# Patient Record
Sex: Male | Born: 1955
Health system: Southern US, Community
[De-identification: ages and names within clinical notes are randomized; demographics above are authoritative.]

## PROBLEM LIST (undated history)

## (undated) DIAGNOSIS — E781 Pure hyperglyceridemia: Secondary | ICD-10-CM

## (undated) DIAGNOSIS — H539 Unspecified visual disturbance: Secondary | ICD-10-CM

## (undated) DIAGNOSIS — I639 Cerebral infarction, unspecified: Secondary | ICD-10-CM

## (undated) DIAGNOSIS — D6859 Other primary thrombophilia: Secondary | ICD-10-CM

## (undated) DIAGNOSIS — Q289 Congenital malformation of circulatory system, unspecified: Secondary | ICD-10-CM

## (undated) DIAGNOSIS — G931 Anoxic brain damage, not elsewhere classified: Secondary | ICD-10-CM

## (undated) HISTORY — DX: Other primary thrombophilia: D68.59

## (undated) HISTORY — DX: Unspecified visual disturbance: H53.9

## (undated) HISTORY — PX: SMALL INTESTINE SURGERY: SHX150

## (undated) HISTORY — DX: Cerebral infarction, unspecified: I63.9

## (undated) HISTORY — DX: Congenital malformation of circulatory system, unspecified: Q28.9

## (undated) HISTORY — DX: Pure hyperglyceridemia: E78.1

## (undated) HISTORY — DX: Anoxic brain damage, not elsewhere classified: G93.1

---

## 1975-07-08 HISTORY — PX: TONSILECTOMY, ADENOIDECTOMY, BILATERAL MYRINGOTOMY AND TUBES: SHX2538

## 1976-07-07 HISTORY — PX: NASAL FRACTURE SURGERY: SHX718

## 1976-07-07 HISTORY — PX: EYE SURGERY: SHX253

## 1988-07-07 HISTORY — PX: ROTATOR CUFF REPAIR: SHX139

## 1996-07-07 HISTORY — PX: LASIK: SHX215

## 2010-07-07 HISTORY — PX: CYST EXCISION: SHX5701

## 2016-01-09 ENCOUNTER — Encounter: Payer: Self-pay | Admitting: Cardiology

## 2016-07-28 ENCOUNTER — Ambulatory Visit (INDEPENDENT_AMBULATORY_CARE_PROVIDER_SITE_OTHER): Payer: BLUE CROSS/BLUE SHIELD

## 2016-07-28 ENCOUNTER — Ambulatory Visit (INDEPENDENT_AMBULATORY_CARE_PROVIDER_SITE_OTHER): Payer: BLUE CROSS/BLUE SHIELD | Admitting: Orthopedic Surgery

## 2016-07-28 DIAGNOSIS — M545 Low back pain: Secondary | ICD-10-CM

## 2016-07-28 DIAGNOSIS — G8929 Other chronic pain: Secondary | ICD-10-CM

## 2016-07-28 NOTE — Progress Notes (Signed)
New patient  Chief complaint lower back pain times several years  61 year old male long-standing history of lower back problems and mid to upper back problems which visit him from time to time with no specific cause. He injured the back many many years ago recovered with therapy and now when his back goes out on him the does exercises and takes Naprosyn  No recent acute injury but about 2 months ago the back went out on him again and he says each time this happens of taking more time to recover while so he finally wants this checked out    Past medical history stroke blood clot gastroesophageal reflux and difficulty with anesthesia  Family history heart disease  Prior surgeries includes tonsillectomy, eye surgery, nasal septum surgery, right shoulder surgery and Lasix surgery right eye  Review of systems  Bowel function normal, bladder function normal, no fever no shortness of breath no chest pain no weight loss  There were no vitals taken for this visit.  General appearance normal grooming hygiene normal appearance body habitus  Oriented 3 Person Pl. and time normal  Mood and affect normal  Gait and station normal  Lumbar spine exam inspection tenderness on the right side of the back just above the iliac crest midline nontender this includes cervical thoracic and lumbar spine Range of motion he was in such pain today could not flex the spine cannot extend the spine Stability right and left hip knee and ankle stable Strength strength right and left leg normal Skin lower back lower legs normal Right and left lower extremity pulses normal Right and left lower extremity sensation and reflexes normal  X-rays Degenerative disc at L5-S1 with anterior osteophyte mild coronal plane abnormality mild sagittal plane abnormality in terms of normal curvature   Diagnosis  Encounter Diagnosis  Name Primary?  . Chronic low back pain, unspecified back pain laterality, with sciatica  presence unspecified Yes     Assessment and plan As the patient has no red flags I am recommending therapy and naproxen follow-up 6 weeks if no improvement we can certainly get an MRI

## 2016-09-15 ENCOUNTER — Ambulatory Visit: Payer: BLUE CROSS/BLUE SHIELD | Admitting: Orthopedic Surgery

## 2016-09-29 ENCOUNTER — Encounter: Payer: Self-pay | Admitting: Orthopedic Surgery

## 2016-09-29 ENCOUNTER — Ambulatory Visit (INDEPENDENT_AMBULATORY_CARE_PROVIDER_SITE_OTHER): Payer: BLUE CROSS/BLUE SHIELD | Admitting: Orthopedic Surgery

## 2016-09-29 DIAGNOSIS — M4716 Other spondylosis with myelopathy, lumbar region: Secondary | ICD-10-CM | POA: Diagnosis not present

## 2016-09-29 NOTE — Progress Notes (Signed)
Progress Note   Patient ID: Douglas BarrowsSamuel D Enochs Jr., male   DOB: 1956-05-09, 61 y.o.   MRN: [id number]  Chief Complaint  Patient presents with  . Follow-up    BACK PAIN    HPI Douglas BarrowsSamuel D Imler Jr. is a 61 y.o. male.   HPI 61 year old male with long-standing lower back pain and episodic discomfort where he is down in bed for several days presents back after his last visit after taking naproxen and having therapy and still having discomfort worrying about his future in terms of his back  Review of Systems Review of Systems He does have a radiating pain down his right leg which is occasional Denies fever  Examination There were no vitals taken for this visit.   Ortho Exam   Medical decision-making Diagnosis, Data, Plan (risk)  Time spent 15 minutes. Most of the visit and the majority of the visit was to discuss treatment options and imaging options  He has severe claustrophobia and will need a CT scan to image his back to guide us on further treatment and to workup the right leg radicular pain  Follow-up after MRI CT  Fuller CanadaStanley Harrison, MD 09/29/2016 3:24 PM

## 2016-10-07 ENCOUNTER — Ambulatory Visit (HOSPITAL_COMMUNITY)
Admission: RE | Admit: 2016-10-07 | Discharge: 2016-10-07 | Disposition: A | Discharge status: Home / Self Care | Payer: BLUE CROSS/BLUE SHIELD | Source: Ambulatory Visit | Attending: Orthopedic Surgery | Admitting: Orthopedic Surgery

## 2016-10-07 DIAGNOSIS — I708 Atherosclerosis of other arteries: Secondary | ICD-10-CM | POA: Diagnosis not present

## 2016-10-07 DIAGNOSIS — M5127 Other intervertebral disc displacement, lumbosacral region: Secondary | ICD-10-CM | POA: Insufficient documentation

## 2016-10-07 DIAGNOSIS — I700 Atherosclerosis of aorta: Secondary | ICD-10-CM | POA: Diagnosis not present

## 2016-10-07 DIAGNOSIS — M5126 Other intervertebral disc displacement, lumbar region: Secondary | ICD-10-CM | POA: Diagnosis not present

## 2016-10-07 DIAGNOSIS — M48061 Spinal stenosis, lumbar region without neurogenic claudication: Secondary | ICD-10-CM | POA: Insufficient documentation

## 2016-10-07 DIAGNOSIS — M4716 Other spondylosis with myelopathy, lumbar region: Secondary | ICD-10-CM | POA: Insufficient documentation

## 2016-10-14 ENCOUNTER — Ambulatory Visit (INDEPENDENT_AMBULATORY_CARE_PROVIDER_SITE_OTHER): Payer: BLUE CROSS/BLUE SHIELD | Admitting: Orthopedic Surgery

## 2016-10-14 ENCOUNTER — Encounter: Payer: Self-pay | Admitting: Orthopedic Surgery

## 2016-10-14 VITALS — BP 123/76 | HR 61 | Ht 69.0 in | Wt 197.0 lb

## 2016-10-14 DIAGNOSIS — M5126 Other intervertebral disc displacement, lumbar region: Secondary | ICD-10-CM | POA: Diagnosis not present

## 2016-10-14 DIAGNOSIS — M4716 Other spondylosis with myelopathy, lumbar region: Secondary | ICD-10-CM | POA: Diagnosis not present

## 2016-10-14 NOTE — Progress Notes (Signed)
Chief Complaint  Patient presents with  . Follow-up    CT lumbar results   Patient has a chronic low back pain with intermittent radicular symptoms  Reviewed the MRI listed below. I don't see any discrepancies in the report after reviewing the images  IMPRESSION: Broad-based disc protrusion at multiple levels. At L5-S1, disc protrusion abuts but does not efface the exiting nerve roots lateral to the respective exit foramina bilaterally. There is borderline stenosis at L3-4 L4-5 due to diffuse disc protrusion coupled with bony hypertrophy. No frank disc extrusion is evident on this study. There is multilevel facet osteoarthritic change. Disc degeneration is noted at L5-S1 with a rather prominent subchondral cyst along the anterior inferior aspect of L5. There is vacuum phenomenon at L4-5 and L5-S1. There is minimal retrolisthesis of L5 on S1, felt to be due to underlying spondylosis. No fracture evident.   There is mild aortoiliac atherosclerosis.     Electronically Signed   By: Bretta Bang III M.D.   On: 10/08/2016 08:19   Referral to neurosurgery

## 2017-03-19 ENCOUNTER — Encounter: Payer: Self-pay | Admitting: Family Medicine

## 2017-03-19 ENCOUNTER — Ambulatory Visit (INDEPENDENT_AMBULATORY_CARE_PROVIDER_SITE_OTHER): Payer: BLUE CROSS/BLUE SHIELD | Admitting: Family Medicine

## 2017-03-19 VITALS — BP 122/80 | HR 59 | Ht 69.0 in | Wt 202.0 lb

## 2017-03-19 DIAGNOSIS — Z23 Encounter for immunization: Secondary | ICD-10-CM | POA: Diagnosis not present

## 2017-03-19 DIAGNOSIS — G4710 Hypersomnia, unspecified: Secondary | ICD-10-CM

## 2017-03-19 DIAGNOSIS — G931 Anoxic brain damage, not elsewhere classified: Secondary | ICD-10-CM

## 2017-03-19 NOTE — Progress Notes (Signed)
Patient ID: Douglas BarrowsSamuel D Ganoe Jr., male    DOB: 06-27-1956, 61 y.o.   MRN: [id number]  Chief Complaint  Patient presents with  . Annual Exam    Allergies Patient has no known allergies.  Subjective:   Douglas BarrowsSamuel D Wilmot Jr. is a 61 y.o. male who [facility].  HPI Here to establish care. Patient report that he has a complicated past history. Reports that many years ago he went in for shoulder surgery and had questionable ischemic injury/hypoxic injury to brain. His parents are from [place] and several years ago after his brother commited suicide [place]. Then moved to [place], [place] years.Reports that he has been seen by neurology for years and that his neurologist  just retired. Was told not needed to be seen by neurology but could be followed by PCP. Is having records sent to our office. Reports that after the brain injury he has used adderall 10mg , 1/4 tablet a day. reports that neuro put him on this after the brain injury for mental alertness and b/c slept all the time. MRI and MRA in 2006 which showed problems with the Circle of Willis.Reports that he does not need refill on the adderall today but that he is planning on coming back for CPE. The neurologist used to give him several months of Adderall at a time and does not need it now but wanted to establish care. Does not have any other complaints. Reports that mood is good. Exercises. Sleeps well. Feels good. Takes care of mother and father. Father is bed ridden and has medical problems.   Patient reports that he had his first TIA 1980. 02/2005 CVA and lost some manual dexterity in right hand.     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 Worked there for 14 months. [place] back [place] after [place] died. Father retired and from 1993-2013 had lots of fun and  father was in sports and senior games. Father had stroke in 2013. Father was diagnosed HIV positive in 2000.   Dr. Estelle JuneJohn Gordon Burch. Previous neurologist.     Past Medical History:  Diagnosis Date  . 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 Stroke (cerebrum) (HCC)   . Vision disturbance     Past Surgical History:  Procedure Laterality Date  . 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 EYE SURGERY  1978   esotropia  . LASIK Right 1998  . NASAL FRACTURE SURGERY  1978  . ROTATOR CUFF REPAIR Right 1990   was unable to complete.   . TONSILECTOMY, ADENOIDECTOMY, BILATERAL MYRINGOTOMY AND TUBES  1977    History reviewed. No pertinent family history.   Social History   Social History  . Marital status: Married    Spouse name: N/A  . Number of children: N/A  . Years of education: N/A   Social History Main Topics  . Smoking status: Never Smoker  . Smokeless tobacco: Never Used  . Alcohol use No  . Drug use: No  . Sexual activity: Not Asked   Other Topics Concern  . None   Social History Narrative   Lives in [place], [place]. Exercise.  Eats all food groups.     Review of Systems  Constitutional: Negative for activity change, appetite change, chills, diaphoresis and fatigue.  Respiratory: Negative for choking, chest tightness and shortness of breath.   Cardiovascular: Negative for chest pain and palpitations.  Musculoskeletal: Negative for arthralgias and gait problem.  Skin: Negative for rash.  Neurological: Negative for dizziness, facial asymmetry and headaches.       Has some loss of manual dexterity in right hand since last stroke.   Hematological: Negative for adenopathy. Does not bruise/bleed easily.  Psychiatric/Behavioral: Negative for agitation, confusion, decreased concentration and sleep disturbance. The patient is not nervous/anxious.      Objective:   BP 122/80    Pulse (!) 59   Ht  (1.753 m)   Wt 202 lb (91.6 kg)   SpO2 97%   BMI 29.83 kg/m   Physical Exam  Constitutional: He is oriented to person, place, and time. He appears well-developed and well-nourished.  HENT:  Head: Normocephalic and atraumatic.  Neck: Normal range of motion. Neck supple.  Cardiovascular: Normal rate, regular rhythm and normal heart sounds.   Pulmonary/Chest: Effort normal and breath sounds normal. No respiratory distress.  Musculoskeletal: Normal range of motion.  Neurological: He is alert and oriented to person, place, and time.  Skin: Skin is warm and dry. No rash noted.  Vitals reviewed.    Assessment and Plan   1. Need for vaccination  - Flu Vaccine QUAD 6+ mos PF IM (Fluarix Quad PF)  2. Excessive sleepiness Will refill Adderall when needed after review of records from neurology. Patient counseled in detail regarding the risks of medication. Told to call or return to clinic if develop any worrisome signs or symptoms. Patient voiced understanding.    3. Anoxic brain injury (HCC) Will review work up and evaluation by neurology.  Records requested.  Follow up for CPE.      Return in about 4 weeks (around 04/16/2017), or CPE. Aliene Beams, MD 03/19/2017

## 2017-04-21 ENCOUNTER — Ambulatory Visit (INDEPENDENT_AMBULATORY_CARE_PROVIDER_SITE_OTHER): Payer: BLUE CROSS/BLUE SHIELD | Admitting: Family Medicine

## 2017-04-21 ENCOUNTER — Encounter: Payer: Self-pay | Admitting: Family Medicine

## 2017-04-21 VITALS — BP 116/74 | HR 67 | Wt 199.0 lb

## 2017-04-21 DIAGNOSIS — G4710 Hypersomnia, unspecified: Secondary | ICD-10-CM

## 2017-04-21 DIAGNOSIS — Z0000 Encounter for general adult medical examination without abnormal findings: Secondary | ICD-10-CM | POA: Diagnosis not present

## 2017-04-21 DIAGNOSIS — Z23 Encounter for immunization: Secondary | ICD-10-CM | POA: Diagnosis not present

## 2017-04-21 DIAGNOSIS — R5383 Other fatigue: Secondary | ICD-10-CM

## 2017-04-21 DIAGNOSIS — R110 Nausea: Secondary | ICD-10-CM | POA: Diagnosis not present

## 2017-04-21 DIAGNOSIS — Z113 Encounter for screening for infections with a predominantly sexual mode of transmission: Secondary | ICD-10-CM | POA: Diagnosis not present

## 2017-04-21 DIAGNOSIS — G931 Anoxic brain damage, not elsewhere classified: Secondary | ICD-10-CM | POA: Diagnosis not present

## 2017-04-21 MED ORDER — METOCLOPRAMIDE HCL 10 MG PO TABS: 10.0000 mg | ORAL_TABLET | Freq: Three times a day (TID) | ORAL | 0 refills | Status: DC | PRN

## 2017-04-21 MED ORDER — AMPHETAMINE-DEXTROAMPHETAMINE 10 MG PO TABS: ORAL_TABLET | ORAL | 0 refills | Status: DC

## 2017-04-21 NOTE — Progress Notes (Deleted)
Patient ID: Douglas Martin., male    DOB: [birth year], 61 y.o.   MRN: [id number]  Chief Complaint  Patient presents with  . Follow-up    Allergies Patient has no known allergies.  Subjective:   Douglas Martin. is a 61 y.o. male who presents to [facility] today.  HPI Here to establish care. Has had trouble over the past week with stomach due to stress. Has used reglan as needed for stomach. Nausea and helps with his "peristalsis". Reports that in the 90s had trouble with the food empyting out of his stomach and it would make him nauseated and cause reflux to worsen. Uses very sporadically and would like a refill. Has had the reflux due to stress. Has had some heartburn lately. Is taking all of his regular medications. Has been working out and exercising. Can work out/swim and does not have any problems. Runs and uses hand weights. No CP or SOB. No DOE.     Past Medical History:  Diagnosis Date  . 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 Stroke (cerebrum) (HCC)   . Vision disturbance     Past Surgical History:  Procedure Laterality Date  . 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 EYE SURGERY  1978   esotropia  . LASIK Right 1998  . NASAL FRACTURE SURGERY  1978  . ROTATOR CUFF REPAIR Right 1990   was unable to complete.   . TONSILECTOMY, ADENOIDECTOMY, BILATERAL MYRINGOTOMY AND TUBES  1977    No family history on file.   Social History   Social History  . Marital status: Married    Spouse name: N/A  . Number of children: N/A  . Years of education: N/A   Social History Main Topics  . Smoking status: Never Smoker  . Smokeless tobacco: Never Used  . Alcohol use No  . Drug use: No  . Sexual activity: Not Asked   Other Topics Concern  . None   Social History Narrative   Lives in [place], [place]. Exercise. Eats all food groups.     Review of Systems    Constitutional: Positive for fatigue. Negative for appetite change and chills.       Can get tired at times. Would like blood checked for CPE. Would like to be checked for issues.   Respiratory: Negative for cough, choking, wheezing and stridor.   Cardiovascular: Negative for chest pain, palpitations and leg swelling.  Gastrointestinal: Negative for abdominal pain, blood in stool and constipation.  Musculoskeletal: Negative for arthralgias and neck pain.     Objective:   BP 116/74   Pulse 67   Wt 199 lb (90.3 kg)   SpO2 96%   BMI 29.39 kg/m   Physical Exam  Constitutional: He is oriented to person, place, and time. He appears well-developed and well-nourished.  HENT:  Head: Normocephalic and atraumatic.  Eyes: Pupils are equal, round, and reactive to light. EOM are normal.  Neck: Normal range of motion. Neck supple. No thyromegaly present.  Cardiovascular: Normal rate, regular rhythm and normal heart sounds.   Pulses:      Dorsalis pedis pulses are 2+ on the right side, and 2+ on the left side.  Pulmonary/Chest: Effort normal and breath sounds normal.  Abdominal: Soft. Bowel sounds are normal.  Musculoskeletal: He exhibits no edema.  Neurological: He is alert and oriented to person, place, and time. No cranial nerve deficit.  Skin: Skin is warm, dry and intact.  Psychiatric: He has a normal mood and affect. His behavior is normal. Judgment and thought content normal.  Vitals reviewed.    Assessment and Plan   1. Excessive sleepiness Patient counseled in detail regarding the risks of medication. Told to call or return to clinic if develop any worrisome signs or symptoms. Patient voiced understanding.  Counseled in detail regarding cardiac risk and neurologic risk. Patient understands and know risks and h - amphetamine-dextroamphetamine (ADDERALL) 10 MG tablet; Use two times a day as directed as needed.  Dispense: 60 tablet; Refill: 0  2. Anoxic brain injury (HCC) *** -  amphetamine-dextroamphetamine (ADDERALL) 10 MG tablet; Use two times a day as directed as needed.  Dispense: 60 tablet; Refill: 0  3. Well adult exam *** - Vitamin B12 - COMPLETE METABOLIC PANEL WITH GFR - Urine Microscopic - CBC with Differential/Platelet - Lipid panel - Hepatic function panel  4. Fatigue, unspecified type *** - TSH - CBC with Differential/Platelet  5. Screening for Infection *** - HIV antibody - RPR - Hepatitis panel, acute  6. Nausea *** - metoCLOPramide (REGLAN) 10 MG tablet; Take 1 tablet (10 mg total) by mouth every 8 (eight) hours as needed for nausea.  Dispense: 20 tablet; Refill: 0    Return in about 6 months (around 10/20/2017) for CPE. Aliene Beams, MD 04/21/2017

## 2017-04-22 ENCOUNTER — Encounter: Payer: Self-pay | Admitting: Family Medicine

## 2017-04-22 LAB — LIPID PANEL
CHOL/HDL RATIO: 3.8 (calc) (ref <5.0)
Cholesterol: 192 mg/dL (ref <200)
HDL: 50 mg/dL (ref >40)
LDL Cholesterol (Calc): 119 mg/dL (calc) — ABNORMAL HIGH
NON-HDL CHOLESTEROL (CALC): 142 mg/dL — AB (ref <130)
TRIGLYCERIDES: 120 mg/dL (ref <150)

## 2017-04-22 LAB — CBC WITH DIFFERENTIAL/PLATELET
BASOS PCT: 0.6 %
Basophils Absolute: 40 cells/uL (ref 0–200)
EOS PCT: 0.9 %
Eosinophils Absolute: 59 cells/uL (ref 15–500)
HCT: 44.2 % (ref 38.5–50.0)
Hemoglobin: 14.9 g/dL (ref 13.2–17.1)
Lymphs Abs: 1947 cells/uL (ref 850–3900)
MCH: 29.7 pg (ref 27.0–33.0)
MCHC: 33.7 g/dL (ref 32.0–36.0)
MCV: 88.2 fL (ref 80.0–100.0)
MONOS PCT: 9.5 %
MPV: 10.3 fL (ref 7.5–12.5)
NEUTROS PCT: 59.5 %
Neutro Abs: 3927 cells/uL (ref 1500–7800)
PLATELETS: 331 10*3/uL (ref 140–400)
RBC: 5.01 10*6/uL (ref 4.20–5.80)
RDW: 12.2 % (ref 11.0–15.0)
TOTAL LYMPHOCYTE: 29.5 %
WBC mixed population: 627 cells/uL (ref 200–950)
WBC: 6.6 10*3/uL (ref 3.8–10.8)

## 2017-04-22 LAB — COMPLETE METABOLIC PANEL WITH GFR
AG RATIO: 1.8 (calc) (ref 1.0–2.5)
ALT: 21 U/L (ref 9–46)
AST: 21 U/L (ref 10–35)
Albumin: 4.4 g/dL (ref 3.6–5.1)
Alkaline phosphatase (APISO): 78 U/L (ref 40–115)
BUN: 12 mg/dL (ref 7–25)
CO2: 31 mmol/L (ref 20–32)
Calcium: 9.2 mg/dL (ref 8.6–10.3)
Chloride: 101 mmol/L (ref 98–110)
Creat: 1.16 mg/dL (ref 0.70–1.25)
GFR, EST AFRICAN AMERICAN: 78 mL/min/{1.73_m2} (ref >=60)
GFR, Est Non African American: 68 mL/min/{1.73_m2} (ref >=60)
Globulin: 2.4 g/dL (calc) (ref 1.9–3.7)
Glucose, Bld: 80 mg/dL (ref 65–139)
Potassium: 4.5 mmol/L (ref 3.5–5.3)
Sodium: 138 mmol/L (ref 135–146)
TOTAL PROTEIN: 6.8 g/dL (ref 6.1–8.1)
Total Bilirubin: 0.5 mg/dL (ref 0.2–1.2)

## 2017-04-22 LAB — HEPATITIS PANEL, ACUTE
HEP A IGM: NONREACTIVE
HEP B S AG: NONREACTIVE
Hep B C IgM: NONREACTIVE
Hepatitis C Ab: NONREACTIVE
SIGNAL TO CUT-OFF: 0.02 (ref <1.00)

## 2017-04-22 LAB — HEPATIC FUNCTION PANEL
AG Ratio: 1.8 (calc) (ref 1.0–2.5)
ALT: 21 U/L (ref 9–46)
AST: 21 U/L (ref 10–35)
Albumin: 4.4 g/dL (ref 3.6–5.1)
Alkaline phosphatase (APISO): 78 U/L (ref 40–115)
BILIRUBIN DIRECT: 0.1 mg/dL (ref 0.0–0.2)
BILIRUBIN INDIRECT: 0.4 mg/dL (ref 0.2–1.2)
Globulin: 2.4 g/dL (calc) (ref 1.9–3.7)
Total Bilirubin: 0.5 mg/dL (ref 0.2–1.2)
Total Protein: 6.8 g/dL (ref 6.1–8.1)

## 2017-04-22 LAB — RPR: RPR: NONREACTIVE

## 2017-04-22 LAB — VITAMIN B12: Vitamin B-12: 773 pg/mL (ref 200–1100)

## 2017-04-22 LAB — URINALYSIS, MICROSCOPIC ONLY
Bacteria, UA: NONE SEEN /HPF
HYALINE CAST: NONE SEEN /LPF
RBC / HPF: NONE SEEN /HPF (ref 0–2)
SQUAMOUS EPITHELIAL / LPF: NONE SEEN /HPF (ref <=5)
WBC, UA: NONE SEEN /HPF (ref 0–5)

## 2017-04-22 LAB — HIV ANTIBODY (ROUTINE TESTING W REFLEX): HIV: NONREACTIVE

## 2017-04-22 LAB — TSH: TSH: 2.09 mIU/L (ref 0.40–4.50)
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
## 2017-05-08 ENCOUNTER — Encounter: Payer: Self-pay | Admitting: Family Medicine

## 2017-05-08 ENCOUNTER — Ambulatory Visit (INDEPENDENT_AMBULATORY_CARE_PROVIDER_SITE_OTHER): Payer: BLUE CROSS/BLUE SHIELD | Admitting: Family Medicine

## 2017-05-08 VITALS — BP 122/78 | HR 66 | Temp 98.4°F | Resp 16 | Ht 72.0 in | Wt 199.0 lb

## 2017-05-08 DIAGNOSIS — R350 Frequency of micturition: Secondary | ICD-10-CM

## 2017-05-08 DIAGNOSIS — Z1211 Encounter for screening for malignant neoplasm of colon: Secondary | ICD-10-CM

## 2017-05-08 DIAGNOSIS — E782 Mixed hyperlipidemia: Secondary | ICD-10-CM | POA: Diagnosis not present

## 2017-05-08 DIAGNOSIS — Z0000 Encounter for general adult medical examination without abnormal findings: Secondary | ICD-10-CM

## 2017-05-08 DIAGNOSIS — N401 Enlarged prostate with lower urinary tract symptoms: Secondary | ICD-10-CM | POA: Diagnosis not present

## 2017-05-08 NOTE — Progress Notes (Signed)
Patient ID: Douglas Wafer., male    DOB: [birth year], 61 y.o.   MRN: [id number]  Chief Complaint  Patient presents with  . Annual Exam    Allergies Patient has no known allergies.  Subjective:   Douglas Mcnee. is a 61 y.o. male who presents to [facility] today.  HPI Here to get his CPE/Wellness exam. Has already had labs done. Reports that has had a history of BPH for years and had prostate digitally examined. Was recommended flomax and finasteride in the past but did not start medications. Reports that has had PSA checked in the past and has always been fine. Has been seen by urologist in the past and had cystoscopy done in the past and had trouble with bladder/frequency. Does not want to see urology or take the medication at this time. Reports that he has some flomax at the house and may try it.   Reports that he is feeling well. Taking all his regular medications.   Benign Prostatic Hypertrophy  This is a recurrent problem. The current episode started more than 1 year ago. The problem has been gradually worsening since onset. Irritative symptoms include frequency and nocturia. Irritative symptoms do not include urgency. Obstructive symptoms include a slower stream, straining and a weak stream. Obstructive symptoms do not include dribbling, incomplete emptying or an intermittent stream. Pertinent negatives include no chills, dysuria, hematuria or nausea. He is sexually active. The symptoms are aggravated by caffeine. Past treatments include nothing.    Past Medical History:  Diagnosis Date  . 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 Stroke (cerebrum) (HCC)   . Vision disturbance     Past Surgical History:  Procedure Laterality Date  . 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 EYE SURGERY  1978   esotropia  . LASIK Right 1998  . NASAL FRACTURE SURGERY  1978  . ROTATOR  CUFF REPAIR Right 1990   was unable to complete.   . TONSILECTOMY, ADENOIDECTOMY, BILATERAL MYRINGOTOMY AND TUBES  1977    Family History  Problem Relation Age of Onset  . Valvular heart disease Mother      Social History   Social History  . Marital status: Married    Spouse name: N/A  . Number of children: N/A  . Years of education: N/A   Social History Main Topics  . Smoking status: Never Smoker  . Smokeless tobacco: Never Used  . Alcohol use No  . Drug use: No  . Sexual activity: Not Asked   Other Topics Concern  . None   Social History Narrative   Lives in [place], [place]. Exercise. Eats all food groups.    Takes care of parents, who lives with him.   Exercise.   Eats all food groups.    Wears seat belt.    Anoxic brain injury s/p surgery   Married.    No children.     Review of Systems  Constitutional: Negative for activity change, chills, diaphoresis, fever and unexpected weight change.  HENT: Negative for ear discharge, ear pain, nosebleeds, sneezing and trouble swallowing.   Eyes: Negative for visual disturbance.  Respiratory: Negative for cough, choking, chest tightness, shortness of breath, wheezing and stridor.   Cardiovascular: Negative for chest pain, palpitations and leg swelling.  Gastrointestinal: Negative for anal bleeding, constipation, diarrhea  and nausea.  Endocrine: Negative for polyphagia and polyuria.  Genitourinary: Positive for frequency and nocturia. Negative for difficulty urinating, dysuria, genital sores, hematuria, incomplete emptying, testicular pain and urgency.  Skin: Negative for rash.  Neurological: Negative for dizziness, tremors, speech difficulty, light-headedness, numbness and headaches.  Hematological: Negative for adenopathy. Does not bruise/bleed easily.  Psychiatric/Behavioral: Negative for decreased concentration, dysphoric mood and sleep disturbance. The patient is not nervous/anxious.      Objective:   BP 122/78 (BP  Location: Left Arm, Patient Position: Sitting, Cuff Size: Normal)   Pulse 66   Temp 98.4 F (36.9 C) (Other (Comment))   Resp 16   Ht 6' (1.829 m)   Wt 199 lb (90.3 kg)   SpO2 98%   BMI 26.99 kg/m   Physical Exam  Constitutional: He is oriented to person, place, and time. He appears well-developed and well-nourished.  HENT:  Head: Normocephalic and atraumatic.  Right Ear: External ear normal.  Left Ear: External ear normal.  Nose: Nose normal.  Mouth/Throat: Oropharynx is clear and moist. No oropharyngeal exudate.  Eyes: Pupils are equal, round, and reactive to light. EOM are normal.  Neck: Normal range of motion. Neck supple. No thyromegaly present.  Cardiovascular: Normal rate, regular rhythm and normal heart sounds.   Pulses:      Dorsalis pedis pulses are 2+ on the right side, and 2+ on the left side.  Pulmonary/Chest: Effort normal and breath sounds normal.  Abdominal: Soft. Bowel sounds are normal.  Genitourinary: Rectum normal and prostate normal.  Musculoskeletal: Normal range of motion. He exhibits no edema.  Neurological: He is alert and oriented to person, place, and time. No cranial nerve deficit. Coordination normal.  Skin: Skin is warm, dry and intact. No pallor.  Psychiatric: He has a normal mood and affect. His behavior is normal. Thought content normal.  Vitals reviewed.    Assessment and Plan  1. Well adult exam Discussed risks versus benefits of labs and digital rectal examination for prostate cancer screening. Patient wishes to proceed with lab testing. In addition today digital told rectal exam was performed. Other labs reviewed that were done prior to his physical. - PSA  2. Benign prostatic hyperplasia with urinary frequency Defers trial of medication at this time. He reports that he has some Flomax at home and will decide if he wants to try it. He will let us know if he does initiate this medication.  3. Screen for colon cancer Referral placed. -  Ambulatory referral to Gastroenterology  4. Mixed hyperlipidemia Patient requested screening for carotid artery disease. Calculated 10 year ASCVD risk of 8.1%. Patient wishes to defer statin therapy at this time. He will initiate dietary changes to lower his LDL cholesterol. Diet and exercise modifications discussed. - US Carotid Duplex Bilateral; Future  Shingrix vaccine recommended. Patient will check with his insurance company.  Return in about 6 months (around 11/05/2017) for follow up. Aliene Beamsachel Gabriel Conry, MD 05/08/2017

## 2017-05-08 NOTE — Patient Instructions (Signed)
Shingrix Vaccine

## 2017-05-12 ENCOUNTER — Encounter (INDEPENDENT_AMBULATORY_CARE_PROVIDER_SITE_OTHER): Payer: Self-pay | Admitting: *Deleted

## 2017-06-01 ENCOUNTER — Telehealth: Payer: Self-pay | Admitting: Family Medicine

## 2017-06-01 NOTE — Telephone Encounter (Signed)
Patient came by to check the status of his ultrasound. Also, he said when he went for his labwork there wasn't an order to check his thyroid Cb#: [phone]

## 2017-06-02 NOTE — Telephone Encounter (Signed)
He had thyroid check in 10/18.  Not indicated to be rechecked at this time. Please advise and check on the status of his carotid ultrasound. Janine Limboachel H. Tracie HarrierHagler, MD

## 2017-06-02 NOTE — Telephone Encounter (Signed)
Called patient to advise of message below.   US scheduled for Friday 06/05/17 at 1:30. [facility]

## 2017-06-03 NOTE — Telephone Encounter (Signed)
Patient informed of message below, verbalized understanding.  

## 2017-06-05 ENCOUNTER — Ambulatory Visit (HOSPITAL_COMMUNITY)
Admission: RE | Admit: 2017-06-05 | Discharge: 2017-06-05 | Disposition: A | Discharge status: Home / Self Care | Payer: BLUE CROSS/BLUE SHIELD | Source: Ambulatory Visit | Attending: Family Medicine | Admitting: Family Medicine

## 2017-06-05 DIAGNOSIS — I771 Stricture of artery: Secondary | ICD-10-CM | POA: Diagnosis not present

## 2017-06-05 DIAGNOSIS — E782 Mixed hyperlipidemia: Secondary | ICD-10-CM | POA: Diagnosis not present

## 2017-06-07 ENCOUNTER — Telehealth: Payer: Self-pay | Admitting: Family Medicine

## 2017-06-07 NOTE — Telephone Encounter (Signed)
Please call and advise that his carotid ultrasound revealed Minimal atherosclerotic disease in the carotid arteries. No significant carotid artery stenosis.We can discuss in more detail at his office visit.  Janine Limboachel H. Tracie HarrierHagler, MD

## 2017-06-08 NOTE — Telephone Encounter (Signed)
Patient informed of message below, verbalized understanding.  

## 2017-09-01 ENCOUNTER — Telehealth: Payer: Self-pay | Admitting: Family Medicine

## 2017-09-01 NOTE — Telephone Encounter (Signed)
Patient dropped off an application FOR RENEWAL OF DISABILITY PARKING PLACARD.

## 2017-09-02 NOTE — Telephone Encounter (Signed)
Called patient regarding message below. No answer, unable to leave message.  

## 2017-09-02 NOTE — Telephone Encounter (Signed)
It is for him to drive his mother places.

## 2017-09-02 NOTE — Telephone Encounter (Signed)
Is this for him or his mother?  Please advise.

## 2017-09-02 NOTE — Telephone Encounter (Signed)
In your box

## 2017-09-02 NOTE — Telephone Encounter (Signed)
Please ask why he needs this?

## 2017-09-04 NOTE — Telephone Encounter (Signed)
Called patient regarding message below. No answer, unable to leave message.  

## 2017-11-05 ENCOUNTER — Ambulatory Visit: Payer: BLUE CROSS/BLUE SHIELD | Admitting: Family Medicine

## 2017-12-10 ENCOUNTER — Encounter: Payer: Self-pay | Admitting: Family Medicine

## 2017-12-11 ENCOUNTER — Encounter: Payer: Self-pay | Admitting: Family Medicine

## 2018-01-12 ENCOUNTER — Other Ambulatory Visit: Payer: Self-pay

## 2018-01-12 DIAGNOSIS — G931 Anoxic brain damage, not elsewhere classified: Secondary | ICD-10-CM

## 2018-01-12 DIAGNOSIS — G4710 Hypersomnia, unspecified: Secondary | ICD-10-CM

## 2018-01-12 MED ORDER — AMPHETAMINE-DEXTROAMPHETAMINE 10 MG PO TABS: 10.0000 mg | ORAL_TABLET | Freq: Two times a day (BID) | ORAL | 0 refills | Status: DC

## 2018-01-26 ENCOUNTER — Other Ambulatory Visit: Payer: Self-pay | Admitting: Family Medicine

## 2018-01-26 NOTE — Telephone Encounter (Signed)
Please send Adderall to [facility] on [address] [place].

## 2018-01-26 NOTE — Telephone Encounter (Signed)
Patient came by the office because he states [facility] never [facility], looking in his chart [facility]. Can you please re route this medication to [facility] on [address] in [place] Cb# [phone]

## 2018-01-27 NOTE — Telephone Encounter (Signed)
Can you please call [facility] and see if the medication was picked up or what the issue was with why it was not received, says it does show that it was sent.

## 2018-01-28 ENCOUNTER — Telehealth: Payer: Self-pay | Admitting: Family Medicine

## 2018-01-28 DIAGNOSIS — G4710 Hypersomnia, unspecified: Secondary | ICD-10-CM

## 2018-01-28 DIAGNOSIS — G931 Anoxic brain damage, not elsewhere classified: Secondary | ICD-10-CM

## 2018-01-28 MED ORDER — AMPHETAMINE-DEXTROAMPHETAMINE 10 MG PO TABS: 10.0000 mg | ORAL_TABLET | Freq: Two times a day (BID) | ORAL | 0 refills | Status: DC

## 2018-01-28 NOTE — Telephone Encounter (Signed)
Left voicemail that it was sent it and to call with further problems

## 2018-01-28 NOTE — Telephone Encounter (Signed)
I checked and the rx wasn't transmitted electronically- it shows it was printed on 7/9. This cannot be accepted by the pharmacy if it was faxed from here.

## 2018-01-28 NOTE — Telephone Encounter (Signed)
Advise that the prescription for Adderall has been electronically sent to the pharmacy.

## 2018-02-17 ENCOUNTER — Telehealth: Payer: Self-pay | Admitting: Family Medicine

## 2018-02-17 DIAGNOSIS — G4710 Hypersomnia, unspecified: Secondary | ICD-10-CM

## 2018-02-17 DIAGNOSIS — G931 Anoxic brain damage, not elsewhere classified: Secondary | ICD-10-CM

## 2018-02-17 MED ORDER — AMPHETAMINE-DEXTROAMPHETAMINE 10 MG PO TABS: 10.0000 mg | ORAL_TABLET | Freq: Two times a day (BID) | ORAL | 0 refills | Status: DC

## 2018-02-17 NOTE — Telephone Encounter (Signed)
Patient came to the office today reporting that [facility]  He was not happy with this.  He reports he has been dealing with this trying to get a prescription for a month although is not contacted our office.  Request a prescription to be printed out for him today.  [facility] and [facility] with Lupita LeashDonna in their pharmacy that he did not pick up this prescription.  Today in the office he was given a printed prescription for Adderall.  He will get future refills from his next new PCP.

## 2018-09-08 DIAGNOSIS — H5005 Alternating esotropia: Secondary | ICD-10-CM | POA: Diagnosis not present

## 2018-09-08 DIAGNOSIS — H5022 Vertical strabismus, left eye: Secondary | ICD-10-CM | POA: Diagnosis not present

## 2018-09-08 DIAGNOSIS — Z8669 Personal history of other diseases of the nervous system and sense organs: Secondary | ICD-10-CM | POA: Diagnosis not present

## 2018-09-08 DIAGNOSIS — Z9889 Other specified postprocedural states: Secondary | ICD-10-CM | POA: Diagnosis not present

## 2018-09-08 DIAGNOSIS — H532 Diplopia: Secondary | ICD-10-CM | POA: Diagnosis not present

## 2018-09-24 ENCOUNTER — Encounter (INDEPENDENT_AMBULATORY_CARE_PROVIDER_SITE_OTHER): Payer: Self-pay | Admitting: Internal Medicine

## 2018-12-30 DIAGNOSIS — M545 Low back pain: Secondary | ICD-10-CM | POA: Diagnosis not present

## 2018-12-30 DIAGNOSIS — Z8673 Personal history of transient ischemic attack (TIA), and cerebral infarction without residual deficits: Secondary | ICD-10-CM | POA: Diagnosis not present

## 2018-12-30 DIAGNOSIS — R799 Abnormal finding of blood chemistry, unspecified: Secondary | ICD-10-CM | POA: Diagnosis not present

## 2018-12-30 DIAGNOSIS — E785 Hyperlipidemia, unspecified: Secondary | ICD-10-CM | POA: Diagnosis not present

## 2019-01-17 DIAGNOSIS — J069 Acute upper respiratory infection, unspecified: Secondary | ICD-10-CM | POA: Diagnosis not present

## 2019-01-17 DIAGNOSIS — M6289 Other specified disorders of muscle: Secondary | ICD-10-CM | POA: Diagnosis not present

## 2019-04-11 ENCOUNTER — Ambulatory Visit (INDEPENDENT_AMBULATORY_CARE_PROVIDER_SITE_OTHER): Payer: BLUE CROSS/BLUE SHIELD | Admitting: Internal Medicine

## 2019-05-18 DIAGNOSIS — S134XXA Sprain of ligaments of cervical spine, initial encounter: Secondary | ICD-10-CM | POA: Diagnosis not present

## 2019-05-18 DIAGNOSIS — S233XXA Sprain of ligaments of thoracic spine, initial encounter: Secondary | ICD-10-CM | POA: Diagnosis not present

## 2019-05-18 DIAGNOSIS — M533 Sacrococcygeal disorders, not elsewhere classified: Secondary | ICD-10-CM | POA: Diagnosis not present

## 2019-06-13 ENCOUNTER — Ambulatory Visit (INDEPENDENT_AMBULATORY_CARE_PROVIDER_SITE_OTHER): Payer: Self-pay | Admitting: Nurse Practitioner

## 2019-06-13 ENCOUNTER — Encounter (INDEPENDENT_AMBULATORY_CARE_PROVIDER_SITE_OTHER): Payer: Self-pay | Admitting: Nurse Practitioner

## 2019-06-15 ENCOUNTER — Telehealth (INDEPENDENT_AMBULATORY_CARE_PROVIDER_SITE_OTHER): Payer: BC Managed Care – PPO | Admitting: Nurse Practitioner

## 2019-06-15 ENCOUNTER — Ambulatory Visit (INDEPENDENT_AMBULATORY_CARE_PROVIDER_SITE_OTHER): Payer: Self-pay | Admitting: Internal Medicine

## 2019-06-15 ENCOUNTER — Encounter (INDEPENDENT_AMBULATORY_CARE_PROVIDER_SITE_OTHER): Payer: Self-pay | Admitting: Nurse Practitioner

## 2019-06-15 VITALS — BP 125/82 | HR 61 | Ht 71.0 in | Wt 186.0 lb

## 2019-06-15 DIAGNOSIS — G8929 Other chronic pain: Secondary | ICD-10-CM | POA: Insufficient documentation

## 2019-06-15 DIAGNOSIS — E781 Pure hyperglyceridemia: Secondary | ICD-10-CM | POA: Insufficient documentation

## 2019-06-15 DIAGNOSIS — M549 Dorsalgia, unspecified: Secondary | ICD-10-CM | POA: Insufficient documentation

## 2019-06-15 NOTE — Assessment & Plan Note (Signed)
He will continue using as needed medications including naproxen and visiting the chiropractor as needed.

## 2019-06-15 NOTE — Progress Notes (Addendum)
Due to national recommendations of social distancing related to the [place] pandemic, an audio/visual tele-health visit was felt to be the most appropriate encounter type for this patient today. I connected with  Douglas Pigg. on 06/15/19 utilizing audio-only technology and verified that I am speaking with the correct person using two identifiers. The patient was located at their home, and I was located at the office of [facility] during the encounter. The patient did not have a device readily available to conduct the visit using video. Thus, audio-only technology was used. I discussed the limitations of evaluation and management by telemedicine. The patient expressed understanding and agreed to proceed.    Subjective:  Patient ID: Douglas Pigg., male    DOB: 04-04-1956  Age: 63 y.o. MRN: [id number]  CC:  Chief Complaint  Patient presents with  . Follow-up      HPI  This patient presents for virtual office visit for follow-up of chronic conditions.  He does have a history of anoxic brain injury, hypertriglyceridemia, back pain.  He is not currently on any medication to treat his cholesterol or reduce risk of stroke other than a low-dose aspirin.  He does take an 81 mg aspirin 1-2 times a week.  Last lipid panel was collected in June 2020 and his triglycerides were normal at that time.  The patient does try to maintain a healthy diet as well as exercise regularly.  He does have intermittent, relapsing back pain.  He did have a recent episode of mid back pain, but went to see a chiropractor which has resolved his pain.  He has taken naproxen in the past as needed for pain relief.  He also mentions to me that his father had a severe allergy to bee venom.  He has noticed as he has had more bee stings over time his reaction seem to be more severe.  This past year he did have a reaction of localized swelling and subsequent secondary infection from a bee sting.  He denies  any swelling of mouth, lips, throat, or difficulty breathing.  Past Medical History:  Diagnosis Date  . Brain damage due to hypoxia ([place])   . 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 Hypertriglyceridemia   . Stroke (cerebrum) ([place])   . Vision disturbance       Family History  Problem Relation Age of Onset  . Valvular heart disease Mother     Social History   Social History Narrative   Lives in [place], [place]. Exercise. Eats all food groups.    Takes care of parents, who lives with him.   Exercise.   Eats all food groups.    Wears seat belt.    Anoxic brain injury s/p surgery   Married.    No children.    Social History   Tobacco Use  . Smoking status: Never Smoker  . Smokeless tobacco: Never Used  Substance Use Topics  . Alcohol use: No     No outpatient medications have been marked as taking for the 06/15/19 encounter (Telemedicine) with Ailene Ards, NP.    ROS:  Review of Systems  Constitutional: Negative.   Respiratory: Negative.   Cardiovascular: Negative.   Musculoskeletal: Positive for back pain (Had a recent episode of midback pain; improved after seeing chiropractor).  Neurological: Negative.      Objective:   Today's Vitals: BP 125/82   Pulse 61   Ht 5'  11" (1.803 m)   Wt 186 lb (84.4 kg)   BMI 25.94 kg/m  Vitals with BMI 06/15/2019 05/08/2017 04/21/2017  Height 5\' 11"  6\' 0"  -  Weight 186 lbs 199 lbs 199 lbs  BMI 25.95 26.98 -  Systolic 125 122  Diastolic 82 78 74  Pulse 61 66 67     Physical Exam Comprehensive physical exam not completed today as office visit was conducted remotely.  He did sound well over the phone, he was alert and oriented, and had appropriate judgment with appropriate answers to questions.      Assessment   1. Hypertriglyceridemia   2. Back pain, unspecified back location, unspecified back pain laterality, unspecified chronicity       Tests ordered No orders of the defined types  were placed in this encounter.    Plan: Please see assessment and plan per problem list below.  Of note, it does not appear that his reaction to bee stings can be classified as a classic hypersensitivity reaction at this time.  However I did tell him that a person can develop a allergy to any substance at any time, and thus if he is stung by a bee again in his reaction appears to become more substantial especially if he experiences swelling in and/or around his mouth/throat, or has difficulty breathing we may need to consider referral to allergist/prescription of EpiPen to be utilized as needed.  He tells me he understands.  No orders of the defined types were placed in this encounter.   Patient to follow-up in 6 months.  , NP

## 2019-06-15 NOTE — Assessment & Plan Note (Signed)
No changes to treatment regimen today.  He will try to follow a healthy lifestyle including heart healthy diet and regular physical activity.  Blood work will be collected at next follow-up during his annual physical exam.

## 2019-06-29 ENCOUNTER — Other Ambulatory Visit: Payer: Self-pay

## 2019-06-29 ENCOUNTER — Ambulatory Visit (INDEPENDENT_AMBULATORY_CARE_PROVIDER_SITE_OTHER): Payer: BC Managed Care – PPO

## 2019-06-29 DIAGNOSIS — Z23 Encounter for immunization: Secondary | ICD-10-CM | POA: Diagnosis not present

## 2019-10-04 ENCOUNTER — Ambulatory Visit (INDEPENDENT_AMBULATORY_CARE_PROVIDER_SITE_OTHER): Payer: Self-pay | Admitting: Nurse Practitioner

## 2019-11-08 ENCOUNTER — Other Ambulatory Visit (INDEPENDENT_AMBULATORY_CARE_PROVIDER_SITE_OTHER): Payer: Self-pay | Admitting: Nurse Practitioner

## 2019-11-08 ENCOUNTER — Telehealth (INDEPENDENT_AMBULATORY_CARE_PROVIDER_SITE_OTHER): Payer: Self-pay | Admitting: Nurse Practitioner

## 2019-11-08 DIAGNOSIS — G931 Anoxic brain damage, not elsewhere classified: Secondary | ICD-10-CM

## 2019-11-08 DIAGNOSIS — G4710 Hypersomnia, unspecified: Secondary | ICD-10-CM

## 2019-11-08 MED ORDER — AMPHETAMINE-DEXTROAMPHETAMINE 10 MG PO TABS: 10.0000 mg | ORAL_TABLET | Freq: Two times a day (BID) | ORAL | 0 refills | Status: DC

## 2019-11-08 NOTE — Telephone Encounter (Signed)
Douglas Martin, please call patient and let him know that his pharmacy send [place] a message that his insurance company will not cover his Adderall.  They will need prior authorization.  In order to complete this prior authorization he does need to have an office visit conducted, thus can you see if you can schedule him for an office visit within the next month or so with either myself or Dr. Karilyn Cota.  Thank you.

## 2019-11-09 NOTE — Telephone Encounter (Signed)
He is scheduled for a office visit next Wednesday Nov 16 2019

## 2019-11-16 ENCOUNTER — Telehealth (INDEPENDENT_AMBULATORY_CARE_PROVIDER_SITE_OTHER): Payer: Self-pay | Admitting: Nurse Practitioner

## 2019-11-16 ENCOUNTER — Other Ambulatory Visit: Payer: Self-pay

## 2019-11-16 ENCOUNTER — Encounter (INDEPENDENT_AMBULATORY_CARE_PROVIDER_SITE_OTHER): Payer: Self-pay | Admitting: Nurse Practitioner

## 2019-11-16 ENCOUNTER — Ambulatory Visit (INDEPENDENT_AMBULATORY_CARE_PROVIDER_SITE_OTHER): Payer: 59 | Admitting: Nurse Practitioner

## 2019-11-16 VITALS — BP 120/80 | HR 66 | Temp 97.6°F | Ht 72.0 in | Wt 187.4 lb

## 2019-11-16 DIAGNOSIS — Z131 Encounter for screening for diabetes mellitus: Secondary | ICD-10-CM

## 2019-11-16 DIAGNOSIS — Z139 Encounter for screening, unspecified: Secondary | ICD-10-CM | POA: Diagnosis not present

## 2019-11-16 DIAGNOSIS — E781 Pure hyperglyceridemia: Secondary | ICD-10-CM | POA: Diagnosis not present

## 2019-11-16 DIAGNOSIS — G4710 Hypersomnia, unspecified: Secondary | ICD-10-CM | POA: Diagnosis not present

## 2019-11-16 DIAGNOSIS — Z1329 Encounter for screening for other suspected endocrine disorder: Secondary | ICD-10-CM

## 2019-11-16 DIAGNOSIS — G931 Anoxic brain damage, not elsewhere classified: Secondary | ICD-10-CM

## 2019-11-16 MED ORDER — AMPHETAMINE-DEXTROAMPHETAMINE 10 MG PO TABS: 10.0000 mg | ORAL_TABLET | Freq: Every day | ORAL | 0 refills | Status: DC | PRN

## 2019-11-16 NOTE — Telephone Encounter (Signed)
So the only place is doing is [facility] in [place] [facility] after 3 pm daily. Will have to be scheduled. You may want to call him and let him know. If he needs help with care of mother and wife just to have someone come over and stay; he can reach out to hospice care for relief sitter to help when he needs appointment like this and not worry about his love ones also.

## 2019-11-16 NOTE — Telephone Encounter (Signed)
Prior Auth has been sent.

## 2019-11-16 NOTE — Telephone Encounter (Signed)
Nellie, will you look to see if [facility] or any imaging center in the [place] or [place] area can do a CA-Score?  It is a type of specialized CT scan.  This patient would like to have this done, however per Dr. Karilyn Cota is not offered currently at [facility] and he is not willing to drive to [facility].  He was wondering if he could have this completed somewhere else, and I told him I would have you look into it.  Thank you.

## 2019-11-16 NOTE — Progress Notes (Signed)
Subjective:  Patient ID: Douglas Pigg., male    DOB: March 29, 1956  Age: 64 y.o. MRN: [id number]  CC:  Chief Complaint  Patient presents with  . Other    Narcolepsy following history of anoxic brain injury  . Hyperlipidemia      HPI  This patient comes in today for the above.  He tells me that back in 1980 he experienced a TIA.  Then in 1990 he did experience a CVA during his shoulder surgery and had another CVA back in 2006.  He tells me he believes he did have ischemic CVA, and had resultant excessive daytime sleepiness as well as weakness to his right hand.  He was evaluated and treated by Dr. Arvilla Market (his prior neurologist who has subsequently retired) for this.  He tells me due to his excessive daytime sleepiness he was trialed on modafinil by Dr. Wendee Copp, but did not tolerate this medication.  He was eventually put on Adderall which resulted in symptom improvement.  He tells me that every once in a while he will take a drug holiday, and he experiences severe fatigue and daytime sleepiness.  He does have a history of elevated triglycerides and mildly elevated LDL in the past.  Last lipid panel was collected in June 2020 and it showed total cholesterol 188, HDL 49, triglycerides of 127, and LDL 115.  He is wondering if he can have a calcium score completed as well as have CRP drawn because he is concerned about possible cardiovascular disease.   Past Medical History:  Diagnosis Date  . Brain damage due to hypoxia ([place])   . 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 Hypertriglyceridemia   . Stroke (cerebrum) ([place])   . Vision disturbance       Family History  Problem Relation Age of Onset  . Valvular heart disease Mother     Social History   Social History Narrative   Lives in [place], [place]. Exercise. Eats all food groups.    Takes care of parents, who lives with him.   Exercise.   Eats all food groups.    Wears seat belt.    Anoxic brain  injury s/p surgery   Married.    No children.    Social History   Tobacco Use  . Smoking status: Never Smoker  . Smokeless tobacco: Never Used  Substance Use Topics  . Alcohol use: No     Current Meds  Medication Sig  . amphetamine-dextroamphetamine (ADDERALL) 10 MG tablet Take 1 tablet (10 mg total) by mouth 2 (two) times daily. Use two times a day as directed as needed.  Marland Kitchen aspirin EC 81 MG tablet Take 81 mg by mouth daily.  . naproxen (NAPROSYN) 250 MG tablet Take 250 mg by mouth as needed.    ROS:  Review of Systems  Respiratory: Negative for cough, shortness of breath and wheezing.   Cardiovascular: Negative for chest pain and palpitations.  Neurological: Positive for weakness.     Objective:   Today's Vitals: BP 120/80 (BP Location: Left Arm, Patient Position: Sitting, Cuff Size: Normal)   Pulse 66   Temp 97.6 F (36.4 C) (Temporal)   Ht 6' (1.829 m)   Wt 187 lb 6.4 oz (85 kg)   SpO2 99%   BMI 25.42 kg/m  Vitals with BMI 11/16/2019 06/15/2019 05/08/2017  Height '6\' 0"'  '5\' 11"'  '6\' 0"'   Weight 187 lbs 6 oz 186 lbs 199  lbs  BMI 25.41 88.33 74.45  Systolic 146 047 998  Diastolic 80 82 78  Pulse 66 61 66     Physical Exam Vitals reviewed.  Constitutional:      Appearance: Normal appearance.  HENT:     Head: Normocephalic and atraumatic.  Cardiovascular:     Rate and Rhythm: Normal rate and regular rhythm.  Pulmonary:     Effort: Pulmonary effort is normal.     Breath sounds: Normal breath sounds.  Musculoskeletal:     Cervical back: Neck supple.  Skin:    General: Skin is warm and dry.  Neurological:     Mental Status: He is alert and oriented to person, place, and time.  Psychiatric:        Mood and Affect: Mood normal.        Behavior: Behavior normal.        Thought Content: Thought content normal.        Judgment: Judgment normal.          Assessment and Plan   1. Hypertriglyceridemia   2. Excessive sleepiness   3. Screening for  condition   4. Screening for diabetes mellitus   5. Thyroid disorder screen   6. Anoxic brain injury ([place])      Plan: 1.,  3.-5.  I will collect blood work today for further evaluation.  He is not willing to commit to [facility] to have calcium score completed, we will see if he can have this completed in a facility closer to this area.  2.. 6.  We will send a prior authorization to patient's insurance company to see if we can refill his Adderall for the off label use of excessive daytime sleepiness.     Tests ordered Orders Placed This Encounter  Procedures  . CBC  . Lipid Panel  . CMP with eGFR([facility])  . Hemoglobin A1c  . TSH  . C-reactive Protein  . Sedimentation Rate      No orders of the defined types were placed in this encounter.   Patient to follow-up in 1 month for annual physical exam. I spent 35 minutes dedicated to the care of this patient on the date of this encounter which includes a combination of either face-to-face or virtual contact with the patient, review of records , and ordering of tests and/or procedures.   Ailene Ards, NP

## 2019-11-16 NOTE — Telephone Encounter (Signed)
Douglas Martin, will you initiate a prior authorization through this patient's health insurance for the following: Adderall 10 mg tablet. Sig: Take 1 tablet by mouth daily as needed.  This is for the off label use of excessive daytime sleepiness.  The progress note from today (11/16/2019) should have all the information you need to complete the prior authorization.  Basically the diagnosis is excessive daytime sleepiness, he has tried modafinil this medication in the past.  He has been on Adderall for his condition with adequate response from the Adderall.  If you have any questions, please let me know.

## 2019-11-17 ENCOUNTER — Encounter (INDEPENDENT_AMBULATORY_CARE_PROVIDER_SITE_OTHER): Payer: Self-pay | Admitting: Nurse Practitioner

## 2019-11-17 ENCOUNTER — Telehealth (INDEPENDENT_AMBULATORY_CARE_PROVIDER_SITE_OTHER): Payer: Self-pay

## 2019-11-17 LAB — COMPLETE METABOLIC PANEL WITH GFR
AG Ratio: 2.1 (calc) (ref 1.0–2.5)
ALT: 16 U/L (ref 9–46)
AST: 18 U/L (ref 10–35)
Albumin: 4.5 g/dL (ref 3.6–5.1)
Alkaline phosphatase (APISO): 72 U/L (ref 35–144)
BUN: 16 mg/dL (ref 7–25)
CO2: 33 mmol/L — ABNORMAL HIGH (ref 20–32)
Calcium: 9.6 mg/dL (ref 8.6–10.3)
Chloride: 103 mmol/L (ref 98–110)
Creat: 1.21 mg/dL (ref 0.70–1.25)
GFR, Est African American: 73 mL/min/{1.73_m2} (ref >=60)
GFR, Est Non African American: 63 mL/min/{1.73_m2} (ref >=60)
Globulin: 2.1 g/dL (calc) (ref 1.9–3.7)
Glucose, Bld: 102 mg/dL — ABNORMAL HIGH (ref 65–99)
Potassium: 5.2 mmol/L (ref 3.5–5.3)
Sodium: 140 mmol/L (ref 135–146)
Total Bilirubin: 0.5 mg/dL (ref 0.2–1.2)
Total Protein: 6.6 g/dL (ref 6.1–8.1)

## 2019-11-17 LAB — HEMOGLOBIN A1C
Hgb A1c MFr Bld: 5 % of total Hgb (ref <5.7)
Mean Plasma Glucose: 97 (calc)
eAG (mmol/L): 5.4 (calc)

## 2019-11-17 LAB — CBC
HCT: 45.7 % (ref 38.5–50.0)
Hemoglobin: 15 g/dL (ref 13.2–17.1)
MCH: 30.2 pg (ref 27.0–33.0)
MCHC: 32.8 g/dL (ref 32.0–36.0)
MCV: 92.1 fL (ref 80.0–100.0)
MPV: 10.4 fL (ref 7.5–12.5)
Platelets: 327 10*3/uL (ref 140–400)
RBC: 4.96 10*6/uL (ref 4.20–5.80)
RDW: 12.5 % (ref 11.0–15.0)
WBC: 7 10*3/uL (ref 3.8–10.8)

## 2019-11-17 LAB — SEDIMENTATION RATE: Sed Rate: 2 mm/h (ref 0–20)

## 2019-11-17 LAB — LIPID PANEL
Cholesterol: 174 mg/dL (ref <200)
HDL: 53 mg/dL (ref >=40)
LDL Cholesterol (Calc): 96 mg/dL (calc)
Non-HDL Cholesterol (Calc): 121 mg/dL (calc) (ref <130)
Total CHOL/HDL Ratio: 3.3 (calc) (ref <5.0)
Triglycerides: 155 mg/dL — ABNORMAL HIGH (ref <150)

## 2019-11-17 LAB — C-REACTIVE PROTEIN: CRP: 1.4 mg/L (ref <8.0)

## 2019-11-17 LAB — TSH: TSH: 3.12 mIU/L (ref 0.40–4.50)

## 2019-11-17 NOTE — Telephone Encounter (Signed)
I left him a voicemail to return my call

## 2019-11-17 NOTE — Telephone Encounter (Signed)
Douglas Martin stated that he would be interested in speaking to someone about getting a sitter for his mom to go have the Calcium Score done

## 2019-11-17 NOTE — Telephone Encounter (Signed)
Okay, it will be an absolute pleasure to see this wonderful man on the next visit!

## 2019-11-17 NOTE — Telephone Encounter (Addendum)
Douglas Martin, please call patient let him know that I had Nellie look into having the calcium score done in another health system that is closer to this area as opposed to going to [place].  She tells me that the only location that is doing calcium score that she knows of in the immediate area would be [facility] in [place].  She did tell me that we could try to set up a relief sitter for his mother through hospice respite care, if he would like to travel to [place] to have this done.  I do not believe it is generally covered by insurance but last time I I talked to Dr. Karilyn Cota he mention it is approximately $150 to get the test completed.  If the patient asks about his blood work results, let him know I am going to send him a letter with his blood work results but overall everything looked good.  If he has any questions please let me know.

## 2019-11-17 NOTE — Telephone Encounter (Signed)
Douglas Martin, will you look into trying to get services available to sit and watch his mother for this patient so that he can possibly undergo calcium score?  If you know for sure that the services can be offered to him, will you then let Dr. Karilyn Cota know sometime next week when I am out of the office so that way he can order the calcium score test.  Then of course assist the patient in scheduling the sitter services.  Also Dr. Karilyn Cota, this patient might be interested in the advanced lipid panel with inflammation lab work that we learned about today. You may want to consider offering this to him in addition to the calcium score.

## 2019-11-21 ENCOUNTER — Telehealth (INDEPENDENT_AMBULATORY_CARE_PROVIDER_SITE_OTHER): Payer: Self-pay

## 2019-11-21 NOTE — Telephone Encounter (Signed)
From Etheleen Sia last request was to find a services to help with homecare to allow pt to come do a caluim scoring procedure . TBA on appt at this time. But gave phone contacts to [facility] & Private duty services they may have. He will work on contacting this week. Then once he has this in place. We can set procedure up for a appt. That will have to be done in [place] at [facility] hospital.(Calcium Scoring).

## 2019-11-21 NOTE — Telephone Encounter (Signed)
So I called [facility], they have a private sitter list for people they use for thing of this nature. To help families in the area. They will fax to me  & I will work on contacting as soon as I can.

## 2019-11-23 NOTE — Telephone Encounter (Signed)
Have we heard back from insurance company regarding the prior authorization?

## 2019-11-23 NOTE — Telephone Encounter (Signed)
Yes yesterday it was denied

## 2019-11-24 ENCOUNTER — Other Ambulatory Visit (INDEPENDENT_AMBULATORY_CARE_PROVIDER_SITE_OTHER): Payer: Self-pay

## 2019-11-24 MED ORDER — AMPHETAMINE-DEXTROAMPHETAMINE 10 MG PO TABS: 10.0000 mg | ORAL_TABLET | Freq: Every day | ORAL | 0 refills | Status: DC | PRN

## 2019-11-24 NOTE — Telephone Encounter (Signed)
Okay, did the denial provide other medication options/why the authorization was denied? If so, please leave that on my desk for review on Monday.

## 2019-11-24 NOTE — Telephone Encounter (Signed)
(  2) You have tried or cannot use amphetamine-dextroamphetamine* and at least four drugs from the following: (a) Atomoxetine. (b) Dexmethylphenidate*. (c) Dextroamphetamine*. (d) Methamphetamine*. (e) Methylphenidate*. (3) The use of the drug is not excluded by your plan (as documented in the limitations and exclusions section of the certification of coverage  The notice of denial is on your desk

## 2019-11-28 NOTE — Telephone Encounter (Signed)
I left a detailed message of recommendation

## 2019-11-28 NOTE — Telephone Encounter (Signed)
Douglas Martin, please call patient and let him know that the insurance denied prior authorization for this medication.  At this point I think he needs to be evaluated and treated by a neurologist to determine the best medication for him that can also meet his needs, and is covered by his insurance.  I can send a referral for him if you would like, please let me know what he says.  Thank you.

## 2019-12-14 ENCOUNTER — Encounter (INDEPENDENT_AMBULATORY_CARE_PROVIDER_SITE_OTHER): Payer: 59 | Admitting: Internal Medicine

## 2020-01-12 ENCOUNTER — Encounter (INDEPENDENT_AMBULATORY_CARE_PROVIDER_SITE_OTHER): Payer: Self-pay | Admitting: Internal Medicine

## 2020-01-12 ENCOUNTER — Ambulatory Visit (INDEPENDENT_AMBULATORY_CARE_PROVIDER_SITE_OTHER): Payer: 59 | Admitting: Internal Medicine

## 2020-01-12 ENCOUNTER — Other Ambulatory Visit: Payer: Self-pay

## 2020-01-12 VITALS — BP 119/80 | HR 62 | Temp 97.1°F | Ht 71.0 in | Wt 188.6 lb

## 2020-01-12 DIAGNOSIS — Z125 Encounter for screening for malignant neoplasm of prostate: Secondary | ICD-10-CM | POA: Diagnosis not present

## 2020-01-12 DIAGNOSIS — G4710 Hypersomnia, unspecified: Secondary | ICD-10-CM

## 2020-01-12 DIAGNOSIS — G931 Anoxic brain damage, not elsewhere classified: Secondary | ICD-10-CM

## 2020-01-12 DIAGNOSIS — E781 Pure hyperglyceridemia: Secondary | ICD-10-CM

## 2020-01-12 MED ORDER — AMPHETAMINE-DEXTROAMPHETAMINE 10 MG PO TABS: 5.0000 mg | ORAL_TABLET | Freq: Every day | ORAL | 0 refills | Status: DC

## 2020-01-12 NOTE — Progress Notes (Signed)
Chief Complaint: This 64 year old man comes in for an annual physical exam. HPI: He has a history of anoxic brain damage, hypertriglyceridemia, history of excessive sleepiness. He takes Adderall for this and has managed quite well with the use of this.  Past Medical History:  Diagnosis Date  . 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 Hypertriglyceridemia   . Stroke (cerebrum) (HCC)   . Vision disturbance    Past Surgical History:  Procedure Laterality Date  . 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 EYE SURGERY  1978   esotropia  . LASIK Right 1998  . NASAL FRACTURE SURGERY  1978  . ROTATOR CUFF REPAIR Right 1990   was unable to complete.   . TONSILECTOMY, ADENOIDECTOMY, BILATERAL MYRINGOTOMY AND TUBES  1977     Social History   Social History Narrative   Lives in [place], [place]. Exercise. Eats all food groups.    Takes care of parents, who lives with him.   Exercise.Loves to cycle.   Eats all food groups.    Wears seat belt.    Anoxic brain injury s/p surgery   Married.    No children.     Social History   Tobacco Use  . Smoking status: Never Smoker  . Smokeless tobacco: Never Used  Substance Use Topics  . Alcohol use: No      Allergies:  Allergies  Allergen Reactions  . Keflex [Cephalexin] Rash    "Niacin-like Flush"     Current Meds  Medication Sig  . amphetamine-dextroamphetamine (ADDERALL) 10 MG tablet Take 0.5 tablets (5 mg total) by mouth daily.  Marland Kitchen aspirin EC 81 MG tablet Take 81 mg by mouth daily.  . naproxen (NAPROSYN) 250 MG tablet Take 250 mg by mouth as needed.  . [DISCONTINUED] amphetamine-dextroamphetamine (ADDERALL) 10 MG tablet Take 1 tablet (10 mg total) by mouth daily as needed.       Depression screen PHQ 2/9 11/16/2019  Decreased Interest 0  Down, Depressed, Hopeless 0  PHQ - 2 Score 0     [id number]:[id number] from the symptoms mentioned  above,there are no other symptoms referable to all systems reviewed.       Physical Exam: Blood pressure 119/80, pulse 62, temperature (!) 97.1 F (36.2 C), temperature source Temporal, height 5\' 11"  (1.803 m), weight 188 lb 9.6 oz (85.5 kg), SpO2 95 %. Vitals with BMI 01/12/2020 11/16/2019 06/15/2019  Height 5\' 11"  6\' 0"  5\' 11"   Weight 188 lbs 10 oz 187 lbs 6 oz 186 lbs  BMI 26.32 25.41 25.95  Systolic 119 120 14/03/2019  Diastolic 80 80 82  Pulse 62 66 61      He looks systemically well.  He is overweight. General: Alert, cooperative, and appears to be the stated age.No pallor.  No jaundice.  No clubbing. Head: Normocephalic Eyes: Sclera white, pupils equal and reactive to light, red reflex x 2,  Ears: Normal bilaterally Oral cavity: Lips, mucosa, and tongue normal: Teeth and gums normal Neck: No adenopathy, supple, symmetrical, trachea midline, and thyroid does not appear enlarged Respiratory: Clear to auscultation bilaterally.No wheezing, crackles or bronchial breathing. Cardiovascular: Heart sounds are present and appear to be normal without murmurs or added sounds.  No carotid bruits.  Peripheral pulses are present and equal bilaterally.: Gastrointestinal:positive bowel sounds, no hepatosplenomegaly.  No masses felt.No tenderness. Skin: Clear, No rashes  noted.No worrisome skin lesions seen. Neurological: Grossly intact without focal findings, cranial nerves II through XII intact, muscle strength equal bilaterally Musculoskeletal: No acute joint abnormalities noted.Full range of movement noted with joints. Psychiatric: Affect appropriate, non-anxious.    Assessment  1. Special screening for malignant neoplasm of prostate   2. Hypertriglyceridemia   3. Excessive sleepiness   4. Anoxic brain injury [facility])     Tests Ordered:   Orders Placed This Encounter  Procedures  . PSA, Total with Reflex to PSA, Free     Plan  1. Relatively healthy 64 year old man. 2. Blood tests  were reviewed that were done in May, all relatively normal.  I will check a PSA today. 3. He will follow-up with Maralyn Sago and 3 months or so.  I have refilled his Adderall today.     Meds ordered this encounter  Medications  . amphetamine-dextroamphetamine (ADDERALL) 10 MG tablet    Sig: Take 0.5 tablets (5 mg total) by mouth daily.    Dispense:  30 tablet    Refill:  0     Sonam Wandel C Linn Clavin   01/12/2020, 3:43 PM

## 2020-01-17 LAB — PSA, TOTAL WITH REFLEX TO PSA, FREE

## 2020-01-24 ENCOUNTER — Ambulatory Visit: Payer: 59 | Admitting: Thoracic Surgery (Cardiothoracic Vascular Surgery)

## 2020-01-25 LAB — PSA, TOTAL WITH REFLEX TO PSA, FREE: PSA, Total: 1.3 ng/mL (ref <=4.0)

## 2020-04-17 ENCOUNTER — Other Ambulatory Visit: Payer: Self-pay

## 2020-04-17 ENCOUNTER — Encounter (INDEPENDENT_AMBULATORY_CARE_PROVIDER_SITE_OTHER): Payer: Self-pay | Admitting: Nurse Practitioner

## 2020-04-17 ENCOUNTER — Ambulatory Visit (INDEPENDENT_AMBULATORY_CARE_PROVIDER_SITE_OTHER): Payer: 59 | Admitting: Nurse Practitioner

## 2020-04-17 VITALS — BP 126/74 | HR 58 | Temp 96.3°F | Ht 71.0 in | Wt 185.4 lb

## 2020-04-17 DIAGNOSIS — E781 Pure hyperglyceridemia: Secondary | ICD-10-CM

## 2020-04-17 DIAGNOSIS — R52 Pain, unspecified: Secondary | ICD-10-CM | POA: Diagnosis not present

## 2020-04-17 DIAGNOSIS — Z23 Encounter for immunization: Secondary | ICD-10-CM | POA: Diagnosis not present

## 2020-04-17 MED ORDER — TRAMADOL HCL 50 MG PO TABS: 50.0000 mg | ORAL_TABLET | Freq: Three times a day (TID) | ORAL | 0 refills | Status: AC | PRN

## 2020-04-17 MED ORDER — NAPROXEN 250 MG PO TABS: 250.0000 mg | ORAL_TABLET | ORAL | 0 refills | Status: DC | PRN

## 2020-04-17 NOTE — Progress Notes (Signed)
Subjective:  Patient ID: Douglas Pigg., male    DOB: 1956/07/02  Age: 64 y.o. MRN: [id number]  CC:  Chief Complaint  Patient presents with  . Follow-up    Doing well      HPI  This patient arrives today for the above.  Elevated triglycerides: He has a history of mildly elevated triglycerides.  He tells me he has been taking omega-3 fish oil daily for the last few weeks.  Overall he is feeling well.  Back pain: He does have intermittent back pain and takes naproxen as needed for this, sometimes he does need to take a tramadol and tells me he would like a refill on his tramadol.  Per controlled substance database no controlled opioids have been prescribed to this patient within the last year.  Past Medical History:  Diagnosis Date  . Brain damage due to hypoxia ([place])   . 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 Hypertriglyceridemia   . Stroke (cerebrum) ([place])   . Vision disturbance       Family History  Problem Relation Age of Onset  . Valvular heart disease Mother   . HIV Father   . Stroke Father   . Suicidality Brother     Social History   Social History Narrative   Lives in [place], [place]. Exercise. Eats all food groups.    Takes care of parents, who lives with him.   Exercise.Loves to cycle.   Eats all food groups.    Wears seat belt.    Anoxic brain injury s/p surgery   Married.    No children.    Social History   Tobacco Use  . Smoking status: Never Smoker  . Smokeless tobacco: Never Used  Substance Use Topics  . Alcohol use: No     Current Meds  Medication Sig  . amphetamine-dextroamphetamine (ADDERALL) 10 MG tablet Take 0.5 tablets (5 mg total) by mouth daily.  Marland Kitchen aspirin EC 81 MG tablet Take 81 mg by mouth daily.  . Lecithin 1200 MG CAPS Take 1,200 mg by mouth daily.  . naproxen (NAPROSYN) 250 MG tablet Take 1 tablet (250 mg total) by mouth as needed.  . Omega-3 1000 MG CAPS Take 1,400 mg by mouth daily.  .  Turmeric 500 MG CAPS Take 1,000 mg by mouth in the morning and at bedtime.  . [DISCONTINUED] naproxen (NAPROSYN) 250 MG tablet Take 250 mg by mouth as needed.    ROS:  Review of Systems  Constitutional: Negative.   Respiratory: Negative.   Cardiovascular: Negative.   Musculoskeletal: Positive for back pain.  Neurological: Negative.      Objective:   Today's Vitals: BP 126/74   Pulse (!) 58   Temp (!) 96.3 F (35.7 C) (Temporal)   Ht _0  (1.803 m)   Wt 185 lb 6.4 oz (84.1 kg)   SpO2 95%   BMI 25.86 kg/m  Vitals with BMI 04/17/2020 01/12/2020 11/16/2019  Height _1  _2  _3   Weight 185 lbs 6 oz 188 lbs 10 oz 187 lbs 6 oz  BMI 25.87 68.34 19.62  Systolic 229 798 921  Diastolic 74 80 80  Pulse 58 62 66     Physical Exam Vitals reviewed.  Constitutional:      Appearance: Normal appearance.  HENT:     Head: Normocephalic and atraumatic.  Cardiovascular:     Rate and Rhythm: Normal rate and regular rhythm.  Pulmonary:  Effort: Pulmonary effort is normal.     Breath sounds: Normal breath sounds.  Musculoskeletal:     Cervical back: Neck supple.  Skin:    General: Skin is warm and dry.  Neurological:     Mental Status: He is alert and oriented to person, place, and time.  Psychiatric:        Mood and Affect: Mood normal.        Behavior: Behavior normal.        Thought Content: Thought content normal.        Judgment: Judgment normal.          Assessment and Plan   1. High triglycerides   2. Flu vaccine need   3. Pain      Plan: 1.  We will check lipid panel and CMP today for further evaluation. 2.  We will administer flu vaccine today. 3.  We will prescribe tramadol to be used as needed for back pain.   Tests ordered Orders Placed This Encounter  Procedures  . Flu Vaccine QUAD 6+ mos PF IM (Fluarix Quad PF)  . Lipid Panel  . CMP with eGFR([facility])      Meds ordered this encounter  Medications  . naproxen (NAPROSYN) 250 MG  tablet    Sig: Take 1 tablet (250 mg total) by mouth as needed.    Dispense:  90 tablet    Refill:  0    Order Specific Question:   Supervising Provider    Answer:   Hurshel Party C [[id number]]  . traMADol (ULTRAM) 50 MG tablet    Sig: Take 1 tablet (50 mg total) by mouth every 8 (eight) hours as needed for up to 5 days.    Dispense:  15 tablet    Refill:  0    Order Specific Question:   Supervising Provider    Answer:   Doree Albee [[id number]]    Patient to follow-up in 6 months or sooner as needed.  Ailene Ards, NP

## 2020-04-18 ENCOUNTER — Encounter (INDEPENDENT_AMBULATORY_CARE_PROVIDER_SITE_OTHER): Payer: Self-pay | Admitting: Nurse Practitioner

## 2020-04-18 LAB — COMPLETE METABOLIC PANEL WITH GFR
AG Ratio: 1.9 (calc) (ref 1.0–2.5)
ALT: 19 U/L (ref 9–46)
AST: 18 U/L (ref 10–35)
Albumin: 4.4 g/dL (ref 3.6–5.1)
Alkaline phosphatase (APISO): 68 U/L (ref 35–144)
BUN: 16 mg/dL (ref 7–25)
CO2: 28 mmol/L (ref 20–32)
Calcium: 9.2 mg/dL (ref 8.6–10.3)
Chloride: 103 mmol/L (ref 98–110)
Creat: 1.15 mg/dL (ref 0.70–1.25)
GFR, Est African American: 78 mL/min/{1.73_m2} (ref >=60)
GFR, Est Non African American: 67 mL/min/{1.73_m2} (ref >=60)
Globulin: 2.3 g/dL (calc) (ref 1.9–3.7)
Glucose, Bld: 85 mg/dL (ref 65–99)
Potassium: 5 mmol/L (ref 3.5–5.3)
Sodium: 140 mmol/L (ref 135–146)
Total Bilirubin: 0.5 mg/dL (ref 0.2–1.2)
Total Protein: 6.7 g/dL (ref 6.1–8.1)

## 2020-04-18 LAB — LIPID PANEL
Cholesterol: 177 mg/dL (ref <200)
HDL: 52 mg/dL (ref >=40)
LDL Cholesterol (Calc): 104 mg/dL (calc) — ABNORMAL HIGH
Non-HDL Cholesterol (Calc): 125 mg/dL (calc) (ref <130)
Total CHOL/HDL Ratio: 3.4 (calc) (ref <5.0)
Triglycerides: 116 mg/dL (ref <150)

## 2020-06-27 ENCOUNTER — Other Ambulatory Visit (INDEPENDENT_AMBULATORY_CARE_PROVIDER_SITE_OTHER): Payer: Self-pay | Admitting: Internal Medicine

## 2020-06-27 MED ORDER — AMPHETAMINE-DEXTROAMPHETAMINE 10 MG PO TABS: 5.0000 mg | ORAL_TABLET | Freq: Every day | ORAL | 0 refills | Status: DC

## 2020-09-04 DIAGNOSIS — M9902 Segmental and somatic dysfunction of thoracic region: Secondary | ICD-10-CM | POA: Diagnosis not present

## 2020-09-04 DIAGNOSIS — M5451 Vertebrogenic low back pain: Secondary | ICD-10-CM | POA: Diagnosis not present

## 2020-09-04 DIAGNOSIS — S233XXA Sprain of ligaments of thoracic spine, initial encounter: Secondary | ICD-10-CM | POA: Diagnosis not present

## 2020-09-04 DIAGNOSIS — M47816 Spondylosis without myelopathy or radiculopathy, lumbar region: Secondary | ICD-10-CM | POA: Diagnosis not present

## 2020-09-14 DIAGNOSIS — R69 Illness, unspecified: Secondary | ICD-10-CM | POA: Diagnosis not present

## 2020-09-26 ENCOUNTER — Telehealth (INDEPENDENT_AMBULATORY_CARE_PROVIDER_SITE_OTHER): Payer: Self-pay

## 2020-09-26 NOTE — Telephone Encounter (Signed)
Called patient and he stated that he went to [facility] here in [place] and he will call to have them fax the paperwork over. Patient is also trying to get an appointment for the PCR Covid test from [facility].

## 2020-09-26 NOTE — Telephone Encounter (Signed)
I see that he lives in [place].  Which urgent care did he go to?  We need the notes.

## 2020-09-26 NOTE — Telephone Encounter (Signed)
Perfect, the PCR COVID-19 test would be very helpful.

## 2020-09-26 NOTE — Telephone Encounter (Signed)
Patient called and left a detailed voice message that he has been sick with something since March 7th, fever, chills, fatigue, has had 2 Negative Covid tests, and went to Urgent Care on March 11th and was given Tamiflu and Zpak and feels a little better but still not feeling well and would like to be seen in the office. Please advise where you want me to schedule this patient?

## 2020-09-27 NOTE — Telephone Encounter (Signed)
Patient called back and left a voice message that he was able to get a PCR test from [facility] and they will contact him in 2-3 days. Patient stated that he is feeling a little better and I also called the office to the [facility] and spoke to [facility] and she stated that she is going to fax the visit info to our office.

## 2020-10-02 NOTE — Telephone Encounter (Signed)
Called patient and LMOVM to return call  Called and left a detailed voice message to follow up with patient.

## 2020-10-17 ENCOUNTER — Other Ambulatory Visit: Payer: Self-pay

## 2020-10-17 ENCOUNTER — Encounter (INDEPENDENT_AMBULATORY_CARE_PROVIDER_SITE_OTHER): Payer: Self-pay | Admitting: Nurse Practitioner

## 2020-10-17 ENCOUNTER — Ambulatory Visit (INDEPENDENT_AMBULATORY_CARE_PROVIDER_SITE_OTHER): Payer: Medicare HMO | Admitting: Nurse Practitioner

## 2020-10-17 VITALS — BP 116/74 | HR 64 | Temp 97.5°F | Ht 71.0 in | Wt 185.8 lb

## 2020-10-17 DIAGNOSIS — M549 Dorsalgia, unspecified: Secondary | ICD-10-CM | POA: Diagnosis not present

## 2020-10-17 DIAGNOSIS — E781 Pure hyperglyceridemia: Secondary | ICD-10-CM

## 2020-10-17 DIAGNOSIS — G4710 Hypersomnia, unspecified: Secondary | ICD-10-CM | POA: Diagnosis not present

## 2020-10-17 MED ORDER — AMPHETAMINE-DEXTROAMPHETAMINE 10 MG PO TABS: 5.0000 mg | ORAL_TABLET | Freq: Every day | ORAL | 0 refills | Status: DC

## 2020-10-17 NOTE — Progress Notes (Signed)
Subjective:  Patient ID: Douglas Martin., male    DOB: 07-06-1956  Age: 65 y.o. MRN: [id number]  CC:  Chief Complaint  Patient presents with  . Follow-up  . Hyperlipidemia  . Back Pain  . Other    Excessive daytime sleepiness      HPI  This patient arrives today for the above.  Hyperlipidemia: He continues on aspirin 81 mg by mouth a few times a week.  Last lipid panel showed LDL of 104.  ASCVD risk score approximate 1.2 based on last lipid panel.  He historically has not been interested in statin therapy.  Back pain: Well controlled with use of as needed tramadol and naproxen.  Excessive daytime sleepiness: He continues to take Adderall as needed for his excessive daytime sleepiness and is requesting refill today.  Past Medical History:  Diagnosis Date  . 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 Hypertriglyceridemia   . Stroke (cerebrum) (HCC)   . Vision disturbance       Family History  Problem Relation Age of Onset  . Valvular heart disease Mother   . HIV Father   . Stroke Father   . Suicidality Brother     Social History   Social History Narrative   Lives in [place], [place]. Exercise. Eats all food groups.    Takes care of parents, who lives with him.   Exercise.Loves to cycle.   Eats all food groups.    Wears seat belt.    Anoxic brain injury s/p surgery   Married.    No children.    Social History   Tobacco Use  . Smoking status: Never Smoker  . Smokeless tobacco: Never Used  Substance Use Topics  . Alcohol use: No     Current Meds  Medication Sig  . aspirin EC 81 MG tablet Take 81 mg by mouth daily.  . Lecithin 1200 MG CAPS Take 1,200 mg by mouth daily.  . naproxen (NAPROSYN) 250 MG tablet Take 1 tablet (250 mg total) by mouth as needed.  . Omega-3 1000 MG CAPS Take 1,400 mg by mouth daily.  . Turmeric 500 MG CAPS Take 1,000 mg by mouth in the morning and at bedtime.  .  [DISCONTINUED] amphetamine-dextroamphetamine (ADDERALL) 10 MG tablet Take 0.5 tablets (5 mg total) by mouth daily.    ROS:  Review of Systems  Eyes: Negative for blurred vision.  Respiratory: Negative for shortness of breath.   Cardiovascular: Negative for chest pain.  Neurological: Negative for dizziness and headaches.     Objective:   Today's Vitals: BP 116/74   Pulse 64   Temp (!) 97.5 F (36.4 C) (Temporal)   Ht 5\' 11"  (1.803 m)   Wt 185 lb 12.8 oz (84.3 kg)   SpO2 97%   BMI 25.91 kg/m  Vitals with BMI 10/17/2020 04/17/2020 01/12/2020  Height 5\' 11"  5\' 11"  5\' 11"   Weight 185 lbs 13 oz 185 lbs 6 oz 188 lbs 10 oz  BMI 25.93 25.87 26.32  Systolic 116 126 03/14/2020  Diastolic 74 74 80  Pulse 64 58 62     Physical Exam Vitals reviewed.  Constitutional:      Appearance: Normal appearance.  HENT:     Head: Normocephalic and atraumatic.  Cardiovascular:     Rate and Rhythm: Normal rate and regular rhythm.  Pulmonary:     Effort: Pulmonary effort is  normal.     Breath sounds: Normal breath sounds.  Musculoskeletal:     Cervical back: Neck supple.  Skin:    General: Skin is warm and dry.  Neurological:     Mental Status: He is alert and oriented to person, place, and time.  Psychiatric:        Mood and Affect: Mood normal.        Behavior: Behavior normal.        Thought Content: Thought content normal.        Judgment: Judgment normal.          Assessment and Plan   1. Excessive sleepiness   2. Hypertriglyceridemia   3. Back pain, unspecified back location, unspecified back pain laterality, unspecified chronicity      Plan: 1.  Refill for Adderall ordered today. 2.  He will continue taking his aspirin and try to live a healthy lifestyle. 3.  He will continue taking his tramadol and naproxen as needed.   Tests ordered No orders of the defined types were placed in this encounter.     Meds ordered this encounter  Medications  . DISCONTD:  amphetamine-dextroamphetamine (ADDERALL) 10 MG tablet    Sig: Take 0.5 tablets (5 mg total) by mouth daily.    Dispense:  30 tablet    Refill:  0    Order Specific Question:   Supervising Provider    Answer:   Lilly Cove C [[id number]]  . DISCONTD: amphetamine-dextroamphetamine (ADDERALL) 10 MG tablet    Sig: Take 0.5 tablets (5 mg total) by mouth daily.    Dispense:  30 tablet    Refill:  0    Order Specific Question:   Supervising Provider    Answer:   Lilly Cove C [[id number]]  . amphetamine-dextroamphetamine (ADDERALL) 10 MG tablet    Sig: Take 0.5 tablets (5 mg total) by mouth daily.    Dispense:  30 tablet    Refill:  0    Order Specific Question:   Supervising Provider    Answer:   Wilson Singer [[id number]]    Patient to follow-up in 6 months or sooner as needed.  Elenore Paddy, NP

## 2020-12-04 ENCOUNTER — Telehealth (INDEPENDENT_AMBULATORY_CARE_PROVIDER_SITE_OTHER): Payer: Self-pay | Admitting: Internal Medicine

## 2020-12-04 ENCOUNTER — Other Ambulatory Visit (INDEPENDENT_AMBULATORY_CARE_PROVIDER_SITE_OTHER): Payer: Self-pay | Admitting: Internal Medicine

## 2020-12-04 MED ORDER — NAPROXEN 250 MG PO TABS: 250.0000 mg | ORAL_TABLET | ORAL | 3 refills | Status: DC | PRN

## 2020-12-04 MED ORDER — TRAMADOL HCL 50 MG PO TABS: 50.0000 mg | ORAL_TABLET | Freq: Every day | ORAL | 0 refills | Status: AC | PRN

## 2020-12-04 NOTE — Telephone Encounter (Signed)
I have sent those prescriptions to the [facility] in [place].

## 2021-02-07 ENCOUNTER — Telehealth (INDEPENDENT_AMBULATORY_CARE_PROVIDER_SITE_OTHER): Payer: Self-pay

## 2021-02-07 ENCOUNTER — Encounter (INDEPENDENT_AMBULATORY_CARE_PROVIDER_SITE_OTHER): Payer: Self-pay

## 2021-02-07 DIAGNOSIS — G4710 Hypersomnia, unspecified: Secondary | ICD-10-CM

## 2021-02-07 MED ORDER — AMPHETAMINE-DEXTROAMPHETAMINE 10 MG PO TABS: 5.0000 mg | ORAL_TABLET | Freq: Every day | ORAL | 0 refills | Status: DC

## 2021-02-07 NOTE — Telephone Encounter (Signed)
Patient came in today since he could not reach [place] by telephone and is requesting a refill of the following sent to [facility] on [address] in [place]:  amphetamine-dextroamphetamine (ADDERALL) 10 MG tablet  Last filled 10/17/2020, # 30 with 0 refills  Send electronically

## 2021-02-11 NOTE — Telephone Encounter (Signed)
Called patient and left a detailed VM to let him know that his medication was sent to his pharmacy.

## 2021-02-12 DIAGNOSIS — S90852A Superficial foreign body, left foot, initial encounter: Secondary | ICD-10-CM | POA: Diagnosis not present

## 2021-03-25 ENCOUNTER — Other Ambulatory Visit: Payer: Self-pay | Admitting: Nurse Practitioner

## 2021-04-10 ENCOUNTER — Other Ambulatory Visit: Payer: Self-pay

## 2021-04-10 ENCOUNTER — Encounter (INDEPENDENT_AMBULATORY_CARE_PROVIDER_SITE_OTHER): Payer: Self-pay

## 2021-04-10 ENCOUNTER — Encounter: Payer: Self-pay | Admitting: Nurse Practitioner

## 2021-04-10 ENCOUNTER — Ambulatory Visit (INDEPENDENT_AMBULATORY_CARE_PROVIDER_SITE_OTHER): Payer: Medicare HMO | Admitting: Nurse Practitioner

## 2021-04-10 VITALS — BP 126/78 | HR 65 | Temp 98.1°F | Wt 184.1 lb

## 2021-04-10 DIAGNOSIS — E781 Pure hyperglyceridemia: Secondary | ICD-10-CM | POA: Diagnosis not present

## 2021-04-10 DIAGNOSIS — Z0001 Encounter for general adult medical examination with abnormal findings: Secondary | ICD-10-CM | POA: Insufficient documentation

## 2021-04-10 DIAGNOSIS — G4710 Hypersomnia, unspecified: Secondary | ICD-10-CM

## 2021-04-10 DIAGNOSIS — Z7689 Persons encountering health services in other specified circumstances: Secondary | ICD-10-CM | POA: Diagnosis not present

## 2021-04-10 DIAGNOSIS — Z23 Encounter for immunization: Secondary | ICD-10-CM

## 2021-04-10 MED ORDER — AMPHETAMINE-DEXTROAMPHETAMINE 10 MG PO TABS: 5.0000 mg | ORAL_TABLET | Freq: Every day | ORAL | 0 refills | Status: DC

## 2021-04-10 NOTE — Assessment & Plan Note (Signed)
-  records available in Epic from Dr. Gosrani 

## 2021-04-10 NOTE — Assessment & Plan Note (Signed)
-  refilled adderall

## 2021-04-10 NOTE — Patient Instructions (Signed)
Please have fasting labs drawn 2-3 days prior to your appointment so we can discuss the results during your office visit.  

## 2021-04-10 NOTE — Assessment & Plan Note (Signed)
-  check lipids with next set of labs 

## 2021-04-10 NOTE — Progress Notes (Signed)
New Patient Office Visit  Subjective:  Patient ID: Douglas Gladson., male    DOB: June 25, 1956  Age: 65 y.o. MRN: [id number]  CC:  Chief Complaint  Patient presents with   New Patient (Initial Visit)    Establish care     HPI Douglas Haven. presents for new patient visit. Transferring care from [facility]. Last physical was over a year ago. Last labs were completed 04/17/21.  He has hx of CVA and anoxic brain injury (during shoulder surgery). He had TIA in 1978-10-25 and stroke in Oct 24, 2004 that was minor. 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  His wife has Alzheimer's and is in a memory care facility in [place], [place], the [place]. His father died in 10/24/17.  Past Medical History:  Diagnosis Date   Brain damage due to hypoxia [facility])    Circulatory anomaly    anomaly in the [place]   Hypertriglyceridemia    Stroke (cerebrum) (HCC)    Thrombophilia (HCC)    from genetic screening by 22 and Me   Vision disturbance     Past Surgical History:  Procedure Laterality Date   CYST EXCISION  2010-10-25   jaw cyst that had grown into the bone of jaw   EYE SURGERY  1978   esotropia   LASIK Right 1998   NASAL FRACTURE SURGERY  1978   ROTATOR CUFF REPAIR Right 1990   was unable to complete.    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 Exercise. Eats all food groups.    Takes care of parents, who lives with him.   Exercise.Loves to cycle.   Eats all food groups.    Wears seat belt.    Anoxic brain injury s/p surgery   Married.    No children.    Social Determinants of Health   Financial Resource Strain: Not on file  Food Insecurity: Not on file  Transportation Needs: Not on file  Physical Activity: Not on file  Stress: Not on file  Social Connections: Not on file  Intimate Partner Violence: Not on file    ROS Review of Systems  Constitutional: Negative.   Respiratory: Negative.    Cardiovascular: Negative.   Musculoskeletal: Negative.   Neurological:  Had CVA previously; has some speech issues, "tongue-tied" at times; otherwise no deficits   Objective:   Today's Vitals: BP 126/78 (BP Location: Right Arm, Patient Position: Sitting, Cuff Size: Large)   Pulse 65   Temp 98.1 F (36.7 C) (Oral)   Wt 184 lb 1.9 oz (83.5 kg)   SpO2 98%   BMI 25.68 kg/m   Physical Exam Constitutional:      Appearance: Normal appearance.  Cardiovascular:     Rate and Rhythm: Normal rate and regular rhythm.     Pulses: Normal pulses.     Heart sounds: Normal heart sounds.  Pulmonary:     Effort: Pulmonary effort is normal.     Breath sounds: Normal breath sounds.  Musculoskeletal:        General: Normal range of motion.  Neurological:     Mental Status: He is alert.  Psychiatric:        Mood and Affect: Mood normal.        Behavior: Behavior normal.        Thought Content: Thought content normal.        Judgment: Judgment normal.    Assessment & Plan:    Problem List Items Addressed This Visit       Other   Excessive sleepiness    -refilled adderall      Relevant Medications   amphetamine-dextroamphetamine (ADDERALL) 10 MG tablet   Hypertriglyceridemia    -check lipids with next set of labs      Establishing care with new doctor, encounter for    -records available in Epic from Dr. Karilyn Cota       Outpatient Encounter Medications as of 04/10/2021  Medication Sig   aspirin EC 81 MG tablet Take 81 mg by mouth daily.   Lecithin 1200 MG CAPS Take 1,200 mg by mouth daily.   naproxen (NAPROSYN) 250 MG tablet Take 1 tablet (250 mg total) by mouth as needed.   Omega-3 1000 MG CAPS Take 1,400 mg by mouth daily.   traMADol (ULTRAM) 50 MG tablet    Turmeric 500 MG CAPS Take 1,000 mg by mouth in the morning and at bedtime.   [DISCONTINUED] amphetamine-dextroamphetamine (ADDERALL) 10 MG tablet Take 0.5 tablets (5 mg total) by mouth daily.   amphetamine-dextroamphetamine (ADDERALL) 10 MG tablet Take 0.5 tablets (5 mg total) by mouth daily.   No facility-administered encounter medications on file as of 04/10/2021.    Follow-up: Return in about 2 months (around 06/10/2021) for Physical Exam.   Heather Roberts, NP

## 2021-04-22 ENCOUNTER — Ambulatory Visit (INDEPENDENT_AMBULATORY_CARE_PROVIDER_SITE_OTHER): Payer: Medicare HMO | Admitting: Internal Medicine

## 2021-06-07 ENCOUNTER — Other Ambulatory Visit: Payer: Self-pay | Admitting: *Deleted

## 2021-06-07 DIAGNOSIS — E781 Pure hyperglyceridemia: Secondary | ICD-10-CM | POA: Diagnosis not present

## 2021-06-08 LAB — CMP14+EGFR
ALT: 18 IU/L (ref 0–44)
AST: 20 IU/L (ref 0–40)
Albumin/Globulin Ratio: 2.8 — ABNORMAL HIGH (ref 1.2–2.2)
Albumin: 4.7 g/dL (ref 3.8–4.8)
Alkaline Phosphatase: 77 IU/L (ref 44–121)
BUN/Creatinine Ratio: 13 (ref 10–24)
BUN: 16 mg/dL (ref 8–27)
Bilirubin Total: 0.3 mg/dL (ref 0.0–1.2)
CO2: 26 mmol/L (ref 20–29)
Calcium: 9.2 mg/dL (ref 8.6–10.2)
Chloride: 102 mmol/L (ref 96–106)
Creatinine, Ser: 1.23 mg/dL (ref 0.76–1.27)
Globulin, Total: 1.7 g/dL (ref 1.5–4.5)
Glucose: 95 mg/dL (ref 70–99)
Potassium: 5 mmol/L (ref 3.5–5.2)
Sodium: 142 mmol/L (ref 134–144)
Total Protein: 6.4 g/dL (ref 6.0–8.5)
eGFR: 65 mL/min/{1.73_m2} (ref >59)

## 2021-06-08 LAB — CBC WITH DIFFERENTIAL/PLATELET
Basophils Absolute: 0.1 10*3/uL (ref 0.0–0.2)
Basos: 1 %
EOS (ABSOLUTE): 0.1 10*3/uL (ref 0.0–0.4)
Eos: 1 %
Hematocrit: 44.8 % (ref 37.5–51.0)
Hemoglobin: 14.6 g/dL (ref 13.0–17.7)
Immature Grans (Abs): 0 10*3/uL (ref 0.0–0.1)
Immature Granulocytes: 0 %
Lymphocytes Absolute: 1.6 10*3/uL (ref 0.7–3.1)
Lymphs: 24 %
MCH: 29.8 pg (ref 26.6–33.0)
MCHC: 32.6 g/dL (ref 31.5–35.7)
MCV: 91 fL (ref 79–97)
Monocytes Absolute: 0.6 10*3/uL (ref 0.1–0.9)
Monocytes: 9 %
Neutrophils Absolute: 4.5 10*3/uL (ref 1.4–7.0)
Neutrophils: 65 %
Platelets: 348 10*3/uL (ref 150–450)
RBC: 4.9 x10E6/uL (ref 4.14–5.80)
RDW: 12.3 % (ref 11.6–15.4)
WBC: 6.8 10*3/uL (ref 3.4–10.8)

## 2021-06-08 LAB — LIPID PANEL
Chol/HDL Ratio: 3.5 ratio (ref 0.0–5.0)
Cholesterol, Total: 198 mg/dL (ref 100–199)
HDL: 57 mg/dL (ref >39)
LDL Chol Calc (NIH): 115 mg/dL — ABNORMAL HIGH (ref 0–99)
Triglycerides: 146 mg/dL (ref 0–149)
VLDL Cholesterol Cal: 26 mg/dL (ref 5–40)

## 2021-06-12 ENCOUNTER — Ambulatory Visit (INDEPENDENT_AMBULATORY_CARE_PROVIDER_SITE_OTHER): Payer: Medicare HMO | Admitting: Nurse Practitioner

## 2021-06-12 ENCOUNTER — Other Ambulatory Visit: Payer: Self-pay

## 2021-06-12 ENCOUNTER — Telehealth: Payer: Self-pay

## 2021-06-12 ENCOUNTER — Encounter: Payer: Self-pay | Admitting: Nurse Practitioner

## 2021-06-12 VITALS — BP 134/81 | HR 65 | Temp 98.2°F | Resp 15 | Ht 71.0 in | Wt 186.0 lb

## 2021-06-12 DIAGNOSIS — Z0001 Encounter for general adult medical examination with abnormal findings: Secondary | ICD-10-CM | POA: Diagnosis not present

## 2021-06-12 DIAGNOSIS — G4710 Hypersomnia, unspecified: Secondary | ICD-10-CM | POA: Diagnosis not present

## 2021-06-12 DIAGNOSIS — E781 Pure hyperglyceridemia: Secondary | ICD-10-CM | POA: Diagnosis not present

## 2021-06-12 DIAGNOSIS — E782 Mixed hyperlipidemia: Secondary | ICD-10-CM

## 2021-06-12 MED ORDER — AMPHETAMINE-DEXTROAMPHETAMINE 10 MG PO TABS: 5.0000 mg | ORAL_TABLET | Freq: Every day | ORAL | 0 refills | Status: DC

## 2021-06-12 MED ORDER — ROSUVASTATIN CALCIUM 5 MG PO TABS
5.0000 mg | ORAL_TABLET | Freq: Every day | ORAL | 3 refills | Status: DC
Start: 2021-06-12 — End: 2021-07-24

## 2021-06-12 MED ORDER — ROSUVASTATIN CALCIUM 5 MG PO TABS: 5.0000 mg | ORAL_TABLET | Freq: Every day | ORAL | 3 refills | Status: DC

## 2021-06-12 NOTE — Patient Instructions (Signed)
-  LDL, or bad cholesterol, was slightly elevated, so try cutting back on fried/fatty foods. With the next set of labs if this is still elevated, we may consider medication.

## 2021-06-12 NOTE — Assessment & Plan Note (Signed)
-  PE unrermarkable

## 2021-06-12 NOTE — Progress Notes (Addendum)
Acute Office Visit  Subjective:    Patient ID: Douglas Martin., male    DOB: May 25, 1956, 65 y.o.   MRN: [id number]  Chief Complaint  Patient presents with   Annual Exam    Lab review     HPI Patient is in today for physical exam and lab review.  No acute complaints.  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    SMALL INTESTINE SURGERY     TONSILECTOMY, ADENOIDECTOMY, BILATERAL MYRINGOTOMY AND TUBES  1977    Family History  Problem Relation Age of Onset   Valvular heart disease Mother    HIV Father    Stroke Father    Suicidality Brother    Cancer Paternal Uncle    Cancer Paternal Grandmother     Social History   Socioeconomic History   Marital status: Married    Spouse name: Not on file   Number of children: 0   Years of education: Not on file   Highest education level: Not on file  Occupational History   Occupation: Retired to take care of his parents in 2013; has worked in Corporate treasurer as hospital EMT    Comment: had mail order business, Psychologist, 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 Exercise. Eats all food groups.    Takes care of parents, who  lives with him.   Exercise.Loves to cycle.   Eats all food groups.    Wears seat belt.    Anoxic brain injury s/p surgery   Married.    No children.    Social Determinants of Health   Financial Resource Strain: Not on file  Food Insecurity: Not on file  Transportation Needs: Not on file  Physical Activity: Not on file  Stress: Not on file  Social Connections: Not on file  Intimate Partner Violence: Not on file    Outpatient Medications Prior to Visit  Medication Sig Dispense Refill   aspirin EC 81 MG tablet Take 81 mg by mouth daily.     Lecithin 1200 MG CAPS Take 1,200 mg by mouth daily.     naproxen (NAPROSYN) 250 MG tablet Take 1 tablet (250 mg total) by mouth as needed. 3 tablet 3   Omega-3 1000 MG CAPS Take 1,400 mg by mouth daily.     traMADol (ULTRAM) 50 MG tablet      Turmeric 500 MG CAPS Take 1,000 mg by mouth in the morning and at bedtime.  amphetamine-dextroamphetamine (ADDERALL) 10 MG tablet Take 0.5 tablets (5 mg total) by mouth daily. 15 tablet 0   No facility-administered medications prior to visit.    Allergies  Allergen Reactions   Keflex [Cephalexin] Rash    "Niacin-like Flush"    Review of Systems  Constitutional: Negative.   HENT: Negative.    Eyes: Negative.   Respiratory: Negative.    Cardiovascular: Negative.   Gastrointestinal: Negative.   Endocrine: Negative.   Genitourinary: Negative.   Musculoskeletal: Negative.   Skin: Negative.   Allergic/Immunologic: Negative.   Neurological: Negative.        Hx of CVA and has decreased coordination in right hand; unable to play guitar as he did prior to the CVA  Hematological: Negative.   Psychiatric/Behavioral: Negative.        Objective:    Physical Exam Constitutional:      Appearance: Normal appearance.  HENT:     Head: Normocephalic and atraumatic.     Right Ear: Tympanic membrane, ear canal and external ear normal.     Left Ear: Tympanic membrane, ear canal and external ear  normal.     Nose: Nose normal.     Mouth/Throat:     Mouth: Mucous membranes are moist.     Pharynx: Oropharynx is clear.  Eyes:     Extraocular Movements: Extraocular movements intact.     Conjunctiva/sclera: Conjunctivae normal.     Pupils: Pupils are equal, round, and reactive to light.  Cardiovascular:     Rate and Rhythm: Normal rate and regular rhythm.     Pulses: Normal pulses.     Heart sounds: Normal heart sounds.  Pulmonary:     Effort: Pulmonary effort is normal.     Breath sounds: Normal breath sounds.  Abdominal:     General: Abdomen is flat. Bowel sounds are normal.     Palpations: Abdomen is soft.  Musculoskeletal:        General: Normal range of motion.     Cervical back: Normal range of motion and neck supple.  Skin:    General: Skin is warm and dry.     Capillary Refill: Capillary refill takes less than 2 seconds.  Neurological:     General: No focal deficit present.     Mental Status: He is alert and oriented to person, place, and time. Mental status is at baseline.     Cranial Nerves: No cranial nerve deficit.     Sensory: No sensory deficit.     Motor: No weakness.     Coordination: Coordination normal.     Gait: Gait normal.  Psychiatric:        Mood and Affect: Mood normal.        Behavior: Behavior normal.        Thought Content: Thought content normal.        Judgment: Judgment normal.    BP 134/81   Pulse 65   Temp 98.2 F (36.8 C) (Oral)   Resp 15   Ht '5\' 11"'  (1.803 m)   Wt 186 lb (84.4 kg)   SpO2 96%   BMI 25.94 kg/m  Wt Readings from Last 3 Encounters:  06/12/21 186 lb (84.4 kg)  04/10/21 184 lb 1.9 oz (83.5 kg)  10/17/20 185 lb 12.8 oz (84.3 kg)    Health Maintenance Due  Topic Date Due   COVID-19 Vaccine (1) Never done   Pneumonia Vaccine 71+ Years old (1 - PCV) Never done   COLONOSCOPY (Pts 45-60yr  Insurance coverage will need to be confirmed)  Never done   Zoster Vaccines- Shingrix (1 of 2) Never done    There are no  preventive care reminders to display for this patient.   Lab Results  Component Value Date   TSH 3.12 11/16/2019   Lab Results  Component Value Date   WBC 6.8 06/07/2021   HGB 14.6 06/07/2021   HCT 44.8 06/07/2021   MCV 91 06/07/2021   PLT 348 06/07/2021   Lab Results  Component Value Date   NA 142 06/07/2021   K 5.0 06/07/2021   CO2 26 06/07/2021   GLUCOSE 95 06/07/2021   BUN 16 06/07/2021   CREATININE 1.23 06/07/2021   BILITOT 0.3 06/07/2021   ALKPHOS 77 06/07/2021   AST 20 06/07/2021   ALT 18 06/07/2021   PROT 6.4 06/07/2021   ALBUMIN 4.7 06/07/2021   CALCIUM 9.2 06/07/2021   EGFR 65 06/07/2021   Lab Results  Component Value Date   CHOL 198 06/07/2021   Lab Results  Component Value Date   HDL 57 06/07/2021   Lab Results  Component Value Date   LDLCALC 115 (H) 06/07/2021   Lab Results  Component Value Date   TRIG 146 06/07/2021   Lab Results  Component Value Date   CHOLHDL 3.5 06/07/2021   Lab Results  Component Value Date   HGBA1C 5.0 11/16/2019       Assessment & Plan:   Problem List Items Addressed This Visit       Other   Excessive sleepiness   Relevant Medications   amphetamine-dextroamphetamine (ADDERALL) 10 MG tablet   Hypertriglyceridemia - Primary    Lab Results  Component Value Date   CHOL 198 06/07/2021   HDL 57 06/07/2021   LDLCALC 115 (H) 06/07/2021   TRIG 146 06/07/2021   CHOLHDL 3.5 06/07/2021  -trigs well controlled -Rx. rosuvastatin -decrease fried/fatty foods      Relevant Medications   rosuvastatin (CRESTOR) 5 MG tablet   Other Relevant Orders   CBC with Differential/Platelet   CMP14+EGFR   Lipid Panel With LDL/HDL Ratio   Encounter for general adult medical examination with abnormal findings    -PE unrermarkable      Other Visit Diagnoses     Mixed hyperlipidemia       Relevant Medications   rosuvastatin (CRESTOR) 5 MG tablet        Meds ordered this encounter  Medications   rosuvastatin  (CRESTOR) 5 MG tablet    Sig: Take 1 tablet (5 mg total) by mouth daily.    Dispense:  90 tablet    Refill:  3   amphetamine-dextroamphetamine (ADDERALL) 10 MG tablet    Sig: Take 0.5 tablets (5 mg total) by mouth daily.    Dispense:  15 tablet    Refill:  0      Noreene Larsson, NP

## 2021-06-12 NOTE — Telephone Encounter (Signed)
Sent it

## 2021-06-12 NOTE — Assessment & Plan Note (Addendum)
Lab Results  Component Value Date   CHOL 198 06/07/2021   HDL 57 06/07/2021   LDLCALC 115 (H) 06/07/2021   TRIG 146 06/07/2021   CHOLHDL 3.5 06/07/2021   -trigs well controlled -Rx. rosuvastatin -decrease fried/fatty foods

## 2021-06-12 NOTE — Addendum Note (Signed)
Addended by: Bjorn Pippin on: 06/12/2021 02:28 PM   Modules accepted: Orders

## 2021-06-12 NOTE — Progress Notes (Signed)
Cholesterol elevated, but I will discuss this with him today.

## 2021-06-12 NOTE — Addendum Note (Signed)
Addended by: Bjorn Pippin on: 06/12/2021 04:32 PM   Modules accepted: Orders

## 2021-06-12 NOTE — Telephone Encounter (Signed)
States he wants the meds that were sent today to be sent to [facility] on [address]. (I reset the crestor already but he needs the adderall sent)

## 2021-06-13 DIAGNOSIS — M545 Low back pain, unspecified: Secondary | ICD-10-CM | POA: Diagnosis not present

## 2021-06-13 DIAGNOSIS — M9902 Segmental and somatic dysfunction of thoracic region: Secondary | ICD-10-CM | POA: Diagnosis not present

## 2021-06-13 DIAGNOSIS — M47816 Spondylosis without myelopathy or radiculopathy, lumbar region: Secondary | ICD-10-CM | POA: Diagnosis not present

## 2021-06-13 DIAGNOSIS — S233XXA Sprain of ligaments of thoracic spine, initial encounter: Secondary | ICD-10-CM | POA: Diagnosis not present

## 2021-06-14 ENCOUNTER — Telehealth: Payer: Self-pay

## 2021-06-14 NOTE — Telephone Encounter (Signed)
Let pt know they meds were sent to [facility] on [address] and they are working on them

## 2021-06-14 NOTE — Telephone Encounter (Signed)
Patient called said his prescriptions was not sent into the correct pharmacy. He went to [facility] at [facility] in [place], told him was sent to a different location.  Also when this prescriptions are sent back, Can you have [facility] give patient a call when received it.   Adderal and Crestor  Please send to [facility] [address].

## 2021-06-14 NOTE — Telephone Encounter (Signed)
Called [facility] they have his Rx's and he will receive a text when they are ready

## 2021-06-21 ENCOUNTER — Telehealth: Payer: Self-pay | Admitting: Nurse Practitioner

## 2021-06-21 DIAGNOSIS — E781 Pure hyperglyceridemia: Secondary | ICD-10-CM

## 2021-06-21 NOTE — Telephone Encounter (Signed)
Pt came by the office looking to get an order for a calcium arterial score test done   Looking for about mid Jan   Can call pt and discuss

## 2021-06-21 NOTE — Telephone Encounter (Signed)
LM for patient to return call.

## 2021-06-24 ENCOUNTER — Telehealth: Payer: Self-pay | Admitting: Nurse Practitioner

## 2021-06-24 NOTE — Telephone Encounter (Signed)
LM for pt. To return call

## 2021-06-24 NOTE — Telephone Encounter (Signed)
See previous TE

## 2021-06-24 NOTE — Telephone Encounter (Signed)
Patient came back in the office this afternoon, he is requesting an order for calcium arterial score test. Has family history of heart disease and would like to have this test ran. He is willing to pay out of pocket if insurance does not cover. Will be leaving to go to [place] for about a week and a half tomorrow but would like to see if this could be set up for [facility] January. Please advise.

## 2021-06-24 NOTE — Telephone Encounter (Signed)
Pt lvm returning nurse call

## 2021-06-25 NOTE — Addendum Note (Signed)
Addended by: Claiborne Rigg D on: 06/25/2021 01:36 PM   Modules accepted: Orders

## 2021-06-25 NOTE — Telephone Encounter (Signed)
Called and spoke with Douglas Martin in Radiology. Correct test to order would be CT Cardiac Score. Patient would be responsible for cost which is $99 and he would pay at time of appointment. Ok to place order and schedule for patient?

## 2021-06-25 NOTE — Telephone Encounter (Signed)
Order has been placed, patient aware

## 2021-06-25 NOTE — Telephone Encounter (Signed)
Please place order and notify pt. Thanks

## 2021-06-25 NOTE — Telephone Encounter (Signed)
Please call radiology as we earlier discussed. thanks

## 2021-07-23 ENCOUNTER — Ambulatory Visit (HOSPITAL_COMMUNITY)
Admission: RE | Admit: 2021-07-23 | Discharge: 2021-07-23 | Disposition: A | Discharge status: Home / Self Care | Payer: Medicare HMO | Source: Ambulatory Visit | Attending: Nurse Practitioner | Admitting: Nurse Practitioner

## 2021-07-23 DIAGNOSIS — E781 Pure hyperglyceridemia: Secondary | ICD-10-CM | POA: Insufficient documentation

## 2021-07-24 ENCOUNTER — Other Ambulatory Visit: Payer: Self-pay | Admitting: Nurse Practitioner

## 2021-07-24 DIAGNOSIS — E782 Mixed hyperlipidemia: Secondary | ICD-10-CM

## 2021-07-24 MED ORDER — ATORVASTATIN CALCIUM 10 MG PO TABS: 10.0000 mg | ORAL_TABLET | Freq: Every day | ORAL | 3 refills | Status: DC

## 2021-08-08 ENCOUNTER — Other Ambulatory Visit: Payer: Self-pay

## 2021-08-08 ENCOUNTER — Ambulatory Visit: Payer: Medicare HMO

## 2021-08-26 ENCOUNTER — Ambulatory Visit (INDEPENDENT_AMBULATORY_CARE_PROVIDER_SITE_OTHER): Payer: Medicare HMO

## 2021-08-26 ENCOUNTER — Ambulatory Visit (HOSPITAL_COMMUNITY): Payer: Medicare HMO

## 2021-08-26 ENCOUNTER — Other Ambulatory Visit: Payer: Self-pay

## 2021-08-26 ENCOUNTER — Telehealth: Payer: Self-pay

## 2021-08-26 DIAGNOSIS — Z0000 Encounter for general adult medical examination without abnormal findings: Secondary | ICD-10-CM | POA: Diagnosis not present

## 2021-08-26 DIAGNOSIS — Z1211 Encounter for screening for malignant neoplasm of colon: Secondary | ICD-10-CM

## 2021-08-26 NOTE — Progress Notes (Signed)
Subjective:   Douglas Martin. is a 66 y.o. male who presents for Medicare Annual/Subsequent preventive examination. I connected with  Douglas Martin. on 08/26/21 by a audio enabled telemedicine application and verified that I am speaking with the correct person using two identifiers.  Patient Location: Home  Provider Location: Office/Clinic  I discussed the limitations of evaluation and management by telemedicine. The patient expressed understanding and agreed to proceed.  Review of Systems    Defer to PCP       Objective:    There were no vitals filed for this visit. There is no height or weight on file to calculate BMI.  No flowsheet data found.  Current Medications (verified) Outpatient Encounter Medications as of 08/26/2021  Medication Sig   amphetamine-dextroamphetamine (ADDERALL) 10 MG tablet Take 0.5 tablets (5 mg total) by mouth daily.   aspirin EC 81 MG tablet Take 81 mg by mouth daily.   atorvastatin (LIPITOR) 10 MG tablet Take 1 tablet (10 mg total) by mouth daily.   Lecithin 1200 MG CAPS Take 1,200 mg by mouth daily.   naproxen (NAPROSYN) 250 MG tablet Take 1 tablet (250 mg total) by mouth as needed.   Omega-3 1000 MG CAPS Take 1,400 mg by mouth daily.   traMADol (ULTRAM) 50 MG tablet    Turmeric 500 MG CAPS Take 1,000 mg by mouth in the morning and at bedtime.   No facility-administered encounter medications on file as of 08/26/2021.    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 Exercise. Eats all food  groups.    Takes care of parents, who lives with him.   Exercise.Loves to cycle.   Eats all food groups.    Wears seat belt.    Anoxic brain injury s/p surgery   Married.    No children.    Social Determinants of Health   Financial Resource Strain: Not on file  Food Insecurity: Not on file  Transportation Needs: Not on file  Physical Activity: Not on file  Stress: Not on file  Social Connections: Not on file    Tobacco Counseling Ready to quit: Not Answered Counseling given: Not Answered   Clinical Intake:                 Diabetic?no         Activities of Daily Living No flowsheet data found.  Patient Care Team: Donell Beers, FNP as PCP - General (Nurse Practitioner)  Indicate any recent Medical Services you may have received from other than [facility]  providers in the past year (date may be approximate).     Assessment:   This is a routine wellness examination for [place].  Hearing/Vision screen No results found.  Dietary issues and exercise activities discussed:     Goals Addressed   None   Depression Screen PHQ 2/9 Scores 04/10/2021 10/17/2020 11/16/2019  PHQ - 2 Score 2 2 0  PHQ- 9 Score 3 3 -  Exception Documentation - - Medical reason    Fall Risk Fall Risk  06/12/2021 04/10/2021 10/17/2020 11/16/2019  Falls in the past year? 0 0 0 0  Number falls in past yr: 0 0 - 0  Injury with Fall? 0 0 - 0  Risk for fall due to : - No Fall Risks - No Fall Risks  Follow up - Falls evaluation completed - Falls evaluation completed    FALL RISK PREVENTION PERTAINING TO THE HOME:  Any stairs in or around the home? Yes  If so, are there any without handrails? Yes  Home free of loose throw rugs in walkways, pet beds, electrical cords, etc? Yes  Adequate lighting in your home to reduce risk of falls? Yes   ASSISTIVE DEVICES UTILIZED TO PREVENT FALLS:  Life alert? No  Use of a cane, walker or w/c? No  Grab bars in the bathroom? Yes  Shower chair or bench in shower? Yes  Elevated toilet seat or a handicapped toilet? Yes    Cognitive Function:        Immunizations Immunization History  Administered Date(s) Administered   Fluad Quad(high Dose 65+) 06/29/2019, 04/10/2021   Influenza,inj,Quad PF,6+ Mos 03/19/2017, 04/17/2020   Tdap 04/21/2017    TDAP status: Up to date  Flu Vaccine status: Up to date  Pneumococcal vaccine status: Due, Education has been provided regarding the importance of this vaccine. Advised may receive this vaccine at local pharmacy or Health Dept. Aware to provide a copy of the vaccination record if obtained from local pharmacy or Health Dept. Verbalized acceptance and understanding.  Covid-19 vaccine status: Information provided on how to obtain vaccines.   Qualifies for Shingles Vaccine? Yes    Zostavax completed No   Shingrix Completed?: No.    Education has been provided regarding the importance of this vaccine. Patient has been advised to call insurance company to determine out of pocket expense if they have not yet received this vaccine. Advised may also receive vaccine at local pharmacy or Health Dept. Verbalized acceptance and understanding.  Screening Tests Health Maintenance  Topic Date  Due   COVID-19 Vaccine (1) Never done   Pneumonia Vaccine 44+ Years old (1 - PCV) Never done   COLONOSCOPY (Pts 45-72yrs Insurance coverage will need to be confirmed)  Never done   Zoster Vaccines- Shingrix (1 of 2) Never done   TETANUS/TDAP  04/22/2027   INFLUENZA VACCINE  Completed   Hepatitis C Screening  Completed   HPV VACCINES  Aged Out    Health Maintenance  Health Maintenance Due  Topic Date Due   COVID-19 Vaccine (1) Never done   Pneumonia Vaccine 36+ Years old (1 - PCV) Never done   COLONOSCOPY (Pts 45-47yrs Insurance coverage will need to be confirmed)  Never done   Zoster Vaccines- Shingrix (1 of 2) Never done    Colorectal cancer screening: Referral to GI placed 08/26/2021. Pt aware the office will call re: appt.  Lung Cancer Screening: (Low Dose CT Chest recommended if Age 43-80 years, 30 pack-year currently smoking OR have quit w/in 15years.) does not qualify.   Lung Cancer Screening Referral: n/a  Additional Screening:  Hepatitis C Screening: does qualify; Completed 04/21/2017  Vision Screening: Recommended annual ophthalmology exams for early detection of glaucoma and other disorders of the eye. Is the patient up to date with their annual eye exam?  No  Who is the provider or what is the name of the office in which the patient attends annual eye exams? [facility] If pt is not established with a provider, would they like to be referred to a provider to establish care? No .   Dental Screening: Recommended annual dental exams for proper oral  hygiene  Community Resource Referral / Chronic Care Management: CRR required this visit?  No   CCM required this visit?  No      Plan:     I have personally reviewed and noted the following in the patients chart:   Medical and social history Use of alcohol, tobacco or illicit drugs  Current medications and supplements including opioid prescriptions. Patient is not currently taking opioid prescriptions. Functional ability and status Nutritional status Physical activity Advanced directives List of other physicians Hospitalizations, surgeries, and ER visits in previous 12 months Vitals Screenings to include cognitive, depression, and falls Referrals and appointments  In addition, I have reviewed and discussed with patient certain preventive protocols, quality metrics, and best practice recommendations. A written personalized care plan for preventive services as well as general preventive health recommendations were provided to patient.     Glendale Chard, CMA   08/26/2021   Nurse Notes:  Mr. Jowers , Thank you for taking time to come for your Medicare Wellness Visit. I appreciate your ongoing commitment to your health goals. Please review the following plan we discussed and let me know if I can assist you in the future.   These are the goals we discussed:  Goals   None     This is a list of the screening recommended for you and due dates:  Health Maintenance  Topic Date Due   COVID-19 Vaccine (1) Never done   Pneumonia Vaccine (1 - PCV) Never done   Colon Cancer Screening  Never done   Zoster (Shingles) Vaccine (1 of 2) Never done   Tetanus Vaccine  04/22/2027   Flu Shot  Completed   Hepatitis C Screening: USPSTF Recommendation to screen - Ages 18-79 yo.  Completed   HPV Vaccine  Aged Out

## 2021-08-26 NOTE — Telephone Encounter (Signed)
Spoke with pt he states that the adderall keeps him awake because he is a two time stroke survivor and that he needs this medication. Pt is wanting to have a referral to someone in [place] who can manage his adderall and pain. Pt states that he has the flu since yesterday, hasn't been tested but says that he gets the flu every so often and he is having the same symptoms so he is wanting tamiflu prescribed. Advised to pt that he may need an appt. Please advise.

## 2021-08-26 NOTE — Patient Instructions (Signed)

## 2021-08-26 NOTE — Telephone Encounter (Signed)
Pt needs a refill on adderall and tramadol.  Pt also states he needs some tamilflu if you could refill that.  I made him aware he may need a visit to get tamiflu.

## 2021-08-27 NOTE — Telephone Encounter (Signed)
Spoke with pt pt scheduled for Thursday 2/23

## 2021-08-28 ENCOUNTER — Encounter: Payer: Self-pay | Admitting: Internal Medicine

## 2021-08-29 ENCOUNTER — Encounter: Payer: Self-pay | Admitting: Nurse Practitioner

## 2021-08-29 ENCOUNTER — Other Ambulatory Visit: Payer: Self-pay

## 2021-08-29 ENCOUNTER — Ambulatory Visit (INDEPENDENT_AMBULATORY_CARE_PROVIDER_SITE_OTHER): Payer: Medicare HMO | Admitting: Nurse Practitioner

## 2021-08-29 DIAGNOSIS — U071 COVID-19: Secondary | ICD-10-CM

## 2021-08-29 DIAGNOSIS — G931 Anoxic brain damage, not elsewhere classified: Secondary | ICD-10-CM

## 2021-08-29 DIAGNOSIS — G4710 Hypersomnia, unspecified: Secondary | ICD-10-CM

## 2021-08-29 MED ORDER — NIRMATRELVIR/RITONAVIR (PAXLOVID)TABLET: 3.0000 | ORAL_TABLET | Freq: Two times a day (BID) | ORAL | 0 refills | Status: AC

## 2021-08-29 NOTE — Assessment & Plan Note (Signed)
°  Patient states that he has had stroke twice ,patient  States that he needs referral to new neurologist. States that he wants a neurologist in [place]  Referral to neurology placed today.

## 2021-08-29 NOTE — Assessment & Plan Note (Signed)
Patient states that he takes Adderall 5mg  daily to keep him awake, he stated that his been taking Adderall since he had stroke.  Patient is asking for referral to a specialist who would prescribe Adderall for him

## 2021-08-29 NOTE — Progress Notes (Signed)
Virtual Visit via Telephone Note  I connected with Douglas Martin @ on 08/29/21 at 2:35pm by telephone and verified that I am speaking with the correct person using two identifiers. I spent 9 minutes on this telephone encounter.   Location: Patient: home  Provider: office   I discussed the limitations, risks, security and privacy concerns of performing an evaluation and management service by telephone and the availability of in person appointments. I also discussed with the patient that there may be a patient responsible charge related to this service. The patient expressed understanding and agreed to proceed.   History of Present Illness: Pt with past medical history of anoxic brain injury, HLD, excessive sleepiness stated that has productive cough, head congestion, body aches, HA , fever that  started 4 days ago pt stated that he did home COVID test yesterday and tested positive for COVID. He has used tamiflu and prednisone , but he still not feeling well.  Patient states that he is taking over the cough medication.   Patient states that he would like or refer to a new neurologist who is in [place] area.  Patient states that he takes Adderall 5mg  daily to keep him awake, he stated that his been taking Adderall since he had stroke.  Patient is asking for referral to a specialist who would prescribe Adderall for him     Observations/Objective:   Assessment and Plan: Covid 19 infection  Paxlovid prescribed,  Take 3 tablets by mouth 2 (two) times daily for 5 days. (Take nirmatrelvir 150 mg two tablets twice daily for 5 days and ritonavir 100 mg one tablet twice daily for 5 days)  Patient told to stay on isolation for 5 days importance of hand hygiene in order to prevent spread of COVID to others around him discussed he verbalized understanding. States that he will take OTC cough medicine for his cough.   Excessive sleepiness. Takes Aderral 2.5mg  daily to keep him awake, states that he has  been taking medication for years. States that his previous CVA makes him feel sleepy, pt refereed today to  CCM for psych referral    History of anoxic brain injury. Pt wants a referral to  a new neurology in [place] area.   Follow Up Instructions:    I discussed the assessment and treatment plan with the patient. The patient was provided an opportunity to ask questions and all were answered. The patient agreed with the plan and demonstrated an understanding of the instructions.   The patient was advised to call back or seek an in-person evaluation if the symptoms worsen or if the condition fails to improve as anticipated.

## 2021-08-29 NOTE — Assessment & Plan Note (Signed)
Covid 19 infection  Paxlovid prescribed,  Take 3 tablets by mouth 2 (two) times daily for 5 days. (Take nirmatrelvir 150 mg two tablets twice daily for 5 days and ritonavir 100 mg one tablet twice daily for 5 days)  Patient told to stay on isolation for 5 days importance of hand hygiene in order to prevent spread of COVID to others around him discussed he verbalized understanding. States that he will take OTC cough medicine for his cough.

## 2021-09-04 ENCOUNTER — Ambulatory Visit: Payer: Self-pay | Admitting: Licensed Clinical Social Worker

## 2021-09-04 ENCOUNTER — Telehealth: Payer: Self-pay | Admitting: *Deleted

## 2021-09-04 NOTE — Chronic Care Management (AMB) (Signed)
?  Chronic Care Management  ? ?Note ? ?09/04/2021 ?Name: Douglas Martin. MRN: [id number] DOB: 11-17-1955 ? ?[name]. is a 66 y.o. year old male who is a primary care patient of Renee Rival, FNP. I reached out to [name]. by phone today in response to a referral sent by Mr. Si Gaul Jr.'s PCP. ? ?Mr. Granados was given information about Chronic Care Management services today including:  ?CCM service includes personalized support from designated clinical staff supervised by his physician, including individualized plan of care and coordination with other care providers ?24/7 contact phone numbers for assistance for urgent and routine care needs. ?Service will only be billed when office clinical staff spend 20 minutes or more in a month to coordinate care. ?Only one practitioner may furnish and bill the service in a calendar month. ?The patient may stop CCM services at any time (effective at the end of the month) by phone call to the office staff. ?The patient is responsible for co-pay (up to 20% after annual deductible is met) if co-pay is required by the individual health plan.  ? ?Patient agreed to services and verbal consent obtained.  ? ?Follow up plan: ?Telephone appointment with care management team member scheduled for:09/12/21 ? ?Laverda Sorenson  ?Care Guide, Embedded Care Coordination ?[facility]  ?Direct Dial: [phone] ? ?

## 2021-09-04 NOTE — Chronic Care Management (AMB) (Signed)
?  Chronic Care Management  ? Clinical Social Work Note ?Consultation Note ? ?09/04/2021 ?Name: Douglas Martin. MRN: [id number] DOB: 1955/12/02 ? ?Douglas Martin. is a 66 y.o. year old male who is a primary care patient of Renee Rival, FNP. The CCM team was consulted reference care coordination needs for medication management. ? ?Assessment: Patient was not interviewed or contacted during this encounter.  Patient needs refill on Adderall which he has been taking for many years. PCP will not refill medication, patient denies needing to speak with a Education officer, museum and does not want therapy. Has appointment with Dr. Merlene Laughter Neurologist on March 8th for excessive sleepiness which is why he reports taking Adderall.   ? ?Intervention:Conducted brief assessment, recommendations and relevant information discussed.  CCM LCSW collaborated with [place]  to assist with meeting patient's needs.   ? ?Follow up Plan: Care Guide will schedule appointment with LCSW after appointment with Dr. Merlene Laughter / [facility] to see if patient was able to get his medication.   ?  ?Review of patient past medical history, allergies, medications, and health status, including review of pertinent consultant reports was performed as part of comprehensive evaluation and provision of care management/care coordination services.  ? ? ?Casimer Lanius, LCSW ?Licensed Clinical Social Worker [facility]  ?CCM LCSW Coverage ?[phone]  ?

## 2021-09-11 DIAGNOSIS — S069XAA Unspecified intracranial injury with loss of consciousness status unknown, initial encounter: Secondary | ICD-10-CM | POA: Diagnosis not present

## 2021-09-11 DIAGNOSIS — I6930 Unspecified sequelae of cerebral infarction: Secondary | ICD-10-CM | POA: Diagnosis not present

## 2021-09-11 DIAGNOSIS — G4710 Hypersomnia, unspecified: Secondary | ICD-10-CM | POA: Diagnosis not present

## 2021-09-11 DIAGNOSIS — G8929 Other chronic pain: Secondary | ICD-10-CM | POA: Diagnosis not present

## 2021-09-11 DIAGNOSIS — M545 Low back pain, unspecified: Secondary | ICD-10-CM | POA: Diagnosis not present

## 2021-09-12 ENCOUNTER — Ambulatory Visit (INDEPENDENT_AMBULATORY_CARE_PROVIDER_SITE_OTHER): Payer: Medicare HMO | Admitting: Licensed Clinical Social Worker

## 2021-09-12 DIAGNOSIS — G931 Anoxic brain damage, not elsewhere classified: Secondary | ICD-10-CM

## 2021-09-12 DIAGNOSIS — Z789 Other specified health status: Secondary | ICD-10-CM

## 2021-09-12 DIAGNOSIS — E781 Pure hyperglyceridemia: Secondary | ICD-10-CM

## 2021-09-12 NOTE — Chronic Care Management (AMB) (Signed)
?  Chronic Care Management  ? Clinical Social Work Note ? ?09/12/2021 ?Name: Douglas Martin. MRN: [id number] DOB: [birth year] ? ?Douglas Pigg. is a 66 y.o. year old male who is a primary care patient of Renee Rival, FNP. The CCM team was consulted to assist the patient with chronic disease management and/or care coordination needs related to:  medication management .  ? ?Engaged with patient by telephone for initial visit in response to provider referral for social work chronic care management and care coordination services.  ? ?Consent to Services:  ?The patient was given the following information about Chronic Care Management services today, agreed to services, and gave verbal consent: 1. CCM service includes personalized support from designated clinical staff supervised by the primary care provider, including individualized plan of care and coordination with other care providers 2. 24/7 contact phone numbers for assistance for urgent and routine care needs. 3. Service will only be billed when office clinical staff spend 20 minutes or more in a month to coordinate care. 4. Only one practitioner may furnish and bill the service in a calendar month. 5.The patient may stop CCM services at any time (effective at the end of the month) by phone call to the office staff. 6. The patient will be responsible for cost sharing (co-pay) of up to 20% of the service fee (after annual deductible is met). Patient agreed to services and consent obtained. ? ?Patient agreed to services and consent obtained.  ? ?Assessment: Assessed patient's previous and current treatment, coping skills, support system and barriers to care. Patient had appointment with Dr. Merlene Laughter, Neurologist, who was able to refill rx for Adderall.No other needs identified by patient at this time.   . No Care Plan was developed during this encounter.  ?Recent life changes /stressors: Running out of adderall and needs this to manage excessive  sleepiness. ? ? ?Follow up Plan:  ?Patient does not require or desire continued follow-up by CCM LCSW. Will contact the office if needed ?CCM LCSW will disconnect from patient's care team at this time, but will be available at any time they would like to re-engage for care coordination services.   ? ? Review of patient past medical history, allergies, medications, and health status, including review of relevant consultants reports was performed today as part of a comprehensive evaluation and provision of chronic care management and care coordination services.    ? ?SDOH (Social Determinants of Health) assessments and interventions performed:   ? ?Casimer Lanius, LCSW ?Licensed Clinical Social Worker [facility]  ?CCM LCSW Coverage for [facility] [place] ?[phone]  ?

## 2021-09-12 NOTE — Patient Instructions (Signed)
? ?  Visit Information  ? ?Thank you for taking time to visit with me today. Please don't hesitate to contact me if I can be of assistance to you before our next scheduled telephone appointment. ? ?Following are the goals we discussed today:  Getting refill of Adderall ? ?Please call the care guide team at [phone] if you need to cancel or reschedule your appointment.  ? ?If you are experiencing a Mental Health or [place] or need someone to talk to, please call the Suicide and Crisis Lifeline: 988 ?call the [place] National Suicide Prevention Lifeline: 762 118 1574 or TTY: 770-045-4048 TTY 913-414-1003) to talk to a trained counselor ?call 1-800-273-TALK (toll free, 24 hour hotline) ?call 911  ? ?Following is a copy of your full care plan:  ?There are no care plans that you recently modified to display for this patient. ? ? ?Consent to CCM Services: ?Mr. Asch was given information about Chronic Care Management services including:  ?CCM service includes personalized support from designated clinical staff supervised by his physician, including individualized plan of care and coordination with other care providers ?24/7 contact phone numbers for assistance for urgent and routine care needs. ?Service will only be billed when office clinical staff spend 20 minutes or more in a month to coordinate care. ?Only one practitioner may furnish and bill the service in a calendar month. ?The patient may stop CCM services at any time (effective at the end of the month) by phone call to the office staff. ?The patient will be responsible for cost sharing (co-pay) of up to 20% of the service fee (after annual deductible is met). ? ?Patient agreed to services and verbal consent obtained.  ? ?The patient verbalized understanding of instructions, educational materials, and care plan provided today and declined offer to receive copy of patient instructions, educational materials, and care plan.  ? ? I am glad you were  able to get your Rx filled ?No follow up scheduled, per our conversation you do not require continued follow up ? ?Casimer Lanius, LCSW ?Licensed Clinical Social Worker [facility]  ?CCM LCSW Coverage for [facility] [place] ?[phone]  ?

## 2021-09-18 ENCOUNTER — Telehealth: Payer: Self-pay

## 2021-09-18 NOTE — Telephone Encounter (Signed)
Patient dropped off updated med list, needs to go over with nurse, needs changes made, patient request nurse to give him a call to go over with over the phone [phone] or [phone]  ?

## 2021-09-19 NOTE — Telephone Encounter (Signed)
Spoke with pt updated med list mailed copy out ?

## 2021-09-20 ENCOUNTER — Other Ambulatory Visit (HOSPITAL_BASED_OUTPATIENT_CLINIC_OR_DEPARTMENT_OTHER): Payer: Self-pay

## 2021-09-20 DIAGNOSIS — R0683 Snoring: Secondary | ICD-10-CM

## 2021-09-20 DIAGNOSIS — G4710 Hypersomnia, unspecified: Secondary | ICD-10-CM

## 2021-09-20 DIAGNOSIS — R0681 Apnea, not elsewhere classified: Secondary | ICD-10-CM

## 2021-09-20 DIAGNOSIS — R5383 Other fatigue: Secondary | ICD-10-CM

## 2021-10-04 DIAGNOSIS — E781 Pure hyperglyceridemia: Secondary | ICD-10-CM

## 2021-10-22 ENCOUNTER — Encounter: Payer: Medicare HMO | Admitting: Neurology

## 2021-10-28 ENCOUNTER — Other Ambulatory Visit: Payer: Self-pay | Admitting: Orthopedic Surgery

## 2021-10-28 ENCOUNTER — Ambulatory Visit (INDEPENDENT_AMBULATORY_CARE_PROVIDER_SITE_OTHER): Payer: Medicare HMO

## 2021-10-28 ENCOUNTER — Ambulatory Visit: Payer: Medicare HMO | Admitting: Orthopedic Surgery

## 2021-10-28 ENCOUNTER — Encounter: Payer: Self-pay | Admitting: Orthopedic Surgery

## 2021-10-28 VITALS — BP 127/83 | HR 75 | Ht 71.0 in | Wt 183.0 lb

## 2021-10-28 DIAGNOSIS — M19079 Primary osteoarthritis, unspecified ankle and foot: Secondary | ICD-10-CM

## 2021-10-28 DIAGNOSIS — M7989 Other specified soft tissue disorders: Secondary | ICD-10-CM

## 2021-10-28 DIAGNOSIS — M25572 Pain in left ankle and joints of left foot: Secondary | ICD-10-CM | POA: Diagnosis not present

## 2021-10-28 DIAGNOSIS — M19071 Primary osteoarthritis, right ankle and foot: Secondary | ICD-10-CM

## 2021-10-28 DIAGNOSIS — M25571 Pain in right ankle and joints of right foot: Secondary | ICD-10-CM

## 2021-10-28 DIAGNOSIS — M79674 Pain in right toe(s): Secondary | ICD-10-CM | POA: Diagnosis not present

## 2021-10-28 NOTE — Patient Instructions (Signed)
[employer] ?Address: [address] [place], [place], [place] [address] ?Hours:  ?Open ? Closes 5?PM ?Phone: [phone] ?

## 2021-10-28 NOTE — Progress Notes (Signed)
Chief Complaint  ?Patient presents with  ? Foot Pain  ?  Right  - injury middle of 2021 hiked some steep mountains in shoes that he hadn't worn before and he has gotten it about 90% but would like to see what is going on. Wears insoles in right for leg discrepancy  ? ? ?HPI: 66 year old male was hiking back in 2021 ups steep mountains when he got down from the mountain he noticed that his foot was hurting.  He has worked on this now by getting massages and shoewear modifications but notes that he is only 90% of where he wants to be and it has interfered with his running.  The pain is in the area of the great toe ? ?Past Medical History:  ?Diagnosis Date  ? 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 Circulatory anomaly   ? anomaly in the [place]  ? Hypertriglyceridemia   ? Stroke (cerebrum) ([place])   ? Thrombophilia ([place])   ? from genetic screening by [employer]  ? Vision disturbance   ? ? ?BP 127/83   Pulse 75   Ht 5\' 11"  (1.803 m)   Wt 183 lb (83 kg)   BMI 25.52 kg/m?  ? ? ?General appearance: Well-developed well-nourished no gross deformities ? ?Cardiovascular normal pulse and perfusion normal color without edema ? ?Neurologically no sensation loss or deficits or pathologic reflexes ? ?Psychological: Awake alert and oriented x3 mood and affect normal ? ?Skin no lacerations or ulcerations no nodularity no palpable masses, no erythema or nodularity ? ?Musculoskeletal: ? ?Right great toe ?Visible dorsal spur good range of motion but he does have pain at terminal flexion no pain or tenderness reproducible in the plantar aspect ? ?Imaging dorsal bunion left great toe especially narrow on the fibular side of the joint ? ?A/P ?Encounter Diagnoses  ?Name Primary?  ? Pain and swelling of toe of right foot Yes  ? 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 ?Recommend shoewear modification with outer shoe lift entire shoe and support for great toe ?

## 2021-10-30 DIAGNOSIS — S069XAA Unspecified intracranial injury with loss of consciousness status unknown, initial encounter: Secondary | ICD-10-CM | POA: Diagnosis not present

## 2021-10-30 DIAGNOSIS — G8929 Other chronic pain: Secondary | ICD-10-CM | POA: Diagnosis not present

## 2021-10-30 DIAGNOSIS — M545 Low back pain, unspecified: Secondary | ICD-10-CM | POA: Diagnosis not present

## 2021-10-30 DIAGNOSIS — I6930 Unspecified sequelae of cerebral infarction: Secondary | ICD-10-CM | POA: Diagnosis not present

## 2021-10-30 DIAGNOSIS — G4710 Hypersomnia, unspecified: Secondary | ICD-10-CM | POA: Diagnosis not present

## 2021-10-31 ENCOUNTER — Ambulatory Visit: Payer: Medicare HMO

## 2021-12-12 ENCOUNTER — Ambulatory Visit: Payer: Medicare HMO | Admitting: Nurse Practitioner

## 2021-12-20 ENCOUNTER — Ambulatory Visit: Payer: Medicare HMO | Admitting: Nurse Practitioner

## 2022-01-02 ENCOUNTER — Ambulatory Visit (INDEPENDENT_AMBULATORY_CARE_PROVIDER_SITE_OTHER): Payer: Medicare HMO | Admitting: Nurse Practitioner

## 2022-01-02 ENCOUNTER — Encounter: Payer: Self-pay | Admitting: Nurse Practitioner

## 2022-01-02 VITALS — BP 132/82 | HR 57 | Ht 71.0 in | Wt 184.0 lb

## 2022-01-02 DIAGNOSIS — Z1211 Encounter for screening for malignant neoplasm of colon: Secondary | ICD-10-CM

## 2022-01-02 DIAGNOSIS — N401 Enlarged prostate with lower urinary tract symptoms: Secondary | ICD-10-CM

## 2022-01-02 DIAGNOSIS — Z0000 Encounter for general adult medical examination without abnormal findings: Secondary | ICD-10-CM

## 2022-01-02 DIAGNOSIS — E781 Pure hyperglyceridemia: Secondary | ICD-10-CM | POA: Diagnosis not present

## 2022-01-02 DIAGNOSIS — R3912 Poor urinary stream: Secondary | ICD-10-CM

## 2022-01-02 DIAGNOSIS — Z0001 Encounter for general adult medical examination with abnormal findings: Secondary | ICD-10-CM

## 2022-01-02 DIAGNOSIS — H543 Unqualified visual loss, both eyes: Secondary | ICD-10-CM

## 2022-01-02 MED ORDER — TAMSULOSIN HCL 0.4 MG PO CAPS: 0.4000 mg | ORAL_CAPSULE | Freq: Every day | ORAL | 3 refills | Status: DC

## 2022-01-02 NOTE — Patient Instructions (Addendum)
  Please get your shingles vaccine and pneumonia vaccine at your pharmacy .   Please start taking flomax 0.4mg  every evening .     It is important that you exercise regularly at least 30 minutes 5 times a week.  Think about what you will eat, plan ahead. Choose " clean, green, fresh or frozen" over canned, processed or packaged foods which are more sugary, salty and fatty. 70 to 75% of food eaten should be vegetables and fruit. Three meals at set times with snacks allowed between meals, but they must be fruit or vegetables. Aim to eat over a 12 hour period , example 7 am to 7 pm, and STOP after  your last meal of the day. Drink water,generally about 64 ounces per day, no other drink is as healthy. Fruit juice is best enjoyed in a healthy way, by EATING the fruit.  Thanks for choosing [facility], we consider it a privelige to serve you.

## 2022-01-02 NOTE — Assessment & Plan Note (Signed)
Wears prescription glasses Due for yearly  eye exam Patient encouraged to follow-up with his ophthalmologist.

## 2022-01-02 NOTE — Assessment & Plan Note (Signed)
Annual exam as documented.  Counseling done include healthy lifestyle involving committing to 150 minutes of exercise per week, heart healthy diet, and attaining healthy weight. The importance of adequate sleep also discussed.  Regular use of seat belt and home safety were also discussed . Changes in health habits are decided on by patient with goals and time frames set for achieving them. Immunization and cancer screening  needs are specifically addressed at this visit.  Patient encouraged to get his shingles and pneumonia vaccine at the pharmacy

## 2022-01-02 NOTE — Assessment & Plan Note (Addendum)
Not taking statin but willing to restart statin , on asprin 81mg   Has had 2 CVA in the past  Need to be on a cholesterol lowering medication dicussed.  Check lipid panel, avoid fatty fried foods

## 2022-01-02 NOTE — Assessment & Plan Note (Addendum)
Chronic condition  Takes saw palmetto Start flomax 0.4mg  daily  Check UA, testosterone, PSA  Will refer to urology if no improvement in 2 months

## 2022-01-02 NOTE — Progress Notes (Addendum)
Complete physical exam  Patient: Douglas Martin.   DOB: 09/19/1955   66 y.o. Male  MRN: [id number]  Subjective:    Chief Complaint  Patient presents with   Hyperlipidemia    6 month f/u     Douglas Martin. is a 66 y.o. male with past medical history of hyper triglyceridemia, excessive sleepiness, anoxic brain injury, BPH who presents today for a complete physical exam. He reports consuming a low fat and low sodium diet.he does strength training and running exercises He generally feels well. He reports sleeping well. He does have additional problems to discuss today.   BPH .  Chronic condition stated that he takes saw palmetto .  Has difficulty starting urine and stopping urine , weak urinary stream, nocturia .  Patient denies incontinence, abdominal pain, bloody urine.    Due for colon cancer screening, Cologuard ordered today.  Due for shingles vaccine and pneumonia vaccine need for both vaccines discussed with patient patient encouraged to get the vaccines at his pharmacy.  Patient stated that he would like his ''CRP '' checked today, denies fever, chills, pain. Willing to pay for cost of lab if his insurance will not pay for the lab.   Most recent fall risk assessment:    01/02/2022    2:33 PM  Fall Risk   Falls in the past year? 0  Number falls in past yr: 0  Injury with Fall? 0  Risk for fall due to : No Fall Risks  Follow up Falls evaluation completed     Most recent depression screenings:    01/02/2022    2:33 PM 08/29/2021    1:23 PM  PHQ 2/9 Scores  PHQ - 2 Score 0 0        Patient Care Team: Donell Beers, FNP as PCP - General (Nurse Practitioner)   Outpatient Medications Prior to Visit  Medication Sig Note   Acetylcysteine (NAC) 600 MG CAPS Take 600 mg by mouth once.    amphetamine-dextroamphetamine (ADDERALL) 10 MG tablet Take 0.5 tablets (5 mg total) by mouth daily.    Ascorbic Acid (VITAMIN C) 1000 MG tablet Take 1,000 mg by mouth daily.     aspirin EC 81 MG tablet Take 81 mg by mouth daily.    Astaxanthin 4 MG CAPS Take 4 mg by mouth daily. 01/02/2022: prn   b complex vitamins capsule Take 1 capsule by mouth daily.    Lecithin 1200 MG CAPS Take 1,200 mg by mouth daily.    magnesium oxide (MAG-OX) 400 MG tablet Take 400 mg by mouth daily.    naproxen (NAPROSYN) 250 MG tablet Take 1 tablet (250 mg total) by mouth as needed.    Omega-3 1000 MG CAPS Take 1,400 mg by mouth daily. 01/02/2022: 3 times weekly   traMADol (ULTRAM) 50 MG tablet 25 mg as needed.    Turmeric 500 MG CAPS Take 500 mg by mouth in the morning and at bedtime.    No facility-administered medications prior to visit.    Review of Systems  Constitutional: Negative.  Negative for chills, fever, malaise/fatigue and weight loss.  HENT: Negative.  Negative for ear discharge, ear pain, hearing loss and tinnitus.   Eyes:  Positive for blurred vision. Negative for pain, discharge and redness.  Respiratory: Negative.  Negative for cough, hemoptysis, sputum production, shortness of breath and wheezing.   Cardiovascular: Negative.  Negative for chest pain, palpitations, orthopnea, claudication and leg swelling.  Gastrointestinal: Negative.  Negative for abdominal pain, constipation, diarrhea, heartburn, nausea and vomiting.  Genitourinary:  Negative for dysuria, flank pain and hematuria.  Musculoskeletal: Negative.  Negative for back pain, falls, joint pain, myalgias and neck pain.  Skin: Negative.  Negative for itching and rash.  Neurological: Negative.  Negative for dizziness, tingling, tremors, sensory change, speech change, focal weakness, loss of consciousness, weakness and headaches.  Endo/Heme/Allergies: Negative.  Negative for environmental allergies and polydipsia. Does not bruise/bleed easily.  Psychiatric/Behavioral:  Negative for depression, hallucinations, substance abuse and suicidal ideas. The patient is not nervous/anxious.           Objective:      BP 132/82 (BP Location: Right Arm, Cuff Size: Normal)   Pulse (!) 57   Ht 5\' 11"  (1.803 m)   Wt 184 lb (83.5 kg)   SpO2 98%   BMI 25.66 kg/m    Physical Exam Constitutional:      General: He is not in acute distress.    Appearance: Normal appearance. He is not ill-appearing, toxic-appearing or diaphoretic.  HENT:     Head: Normocephalic and atraumatic.     Right Ear: Tympanic membrane, ear canal and external ear normal. There is no impacted cerumen.     Left Ear: Tympanic membrane, ear canal and external ear normal. There is no impacted cerumen.     Nose: Nose normal. No congestion or rhinorrhea.     Mouth/Throat:     Mouth: Mucous membranes are moist.     Pharynx: Oropharynx is clear. No oropharyngeal exudate or posterior oropharyngeal erythema.  Eyes:     General: No scleral icterus.       Right eye: No discharge.        Left eye: No discharge.     Extraocular Movements: Extraocular movements intact.     Conjunctiva/sclera: Conjunctivae normal.     Pupils: Pupils are equal, round, and reactive to light.  Neck:     Vascular: No carotid bruit.  Cardiovascular:     Rate and Rhythm: Normal rate and regular rhythm.     Pulses: Normal pulses.     Heart sounds: Normal heart sounds. No murmur heard.    No friction rub. No gallop.  Pulmonary:     Effort: Pulmonary effort is normal. No respiratory distress.     Breath sounds: Normal breath sounds. No stridor. No wheezing, rhonchi or rales.  Chest:     Chest wall: No tenderness.  Abdominal:     General: Bowel sounds are normal. There is no distension.     Palpations: There is no mass.     Tenderness: There is no abdominal tenderness. There is no right CVA tenderness, left CVA tenderness, guarding or rebound.     Hernia: No hernia is present.  Musculoskeletal:        General: No swelling, tenderness, deformity or signs of injury. Normal range of motion.     Cervical back: Normal range of motion and neck supple. No rigidity or  tenderness.     Right lower leg: No edema.     Left lower leg: No edema.  Lymphadenopathy:     Cervical: No cervical adenopathy.  Skin:    General: Skin is warm and dry.     Capillary Refill: Capillary refill takes less than 2 seconds.     Coloration: Skin is not jaundiced or pale.     Findings: No bruising, erythema, lesion or rash.  Neurological:     Mental Status: He is alert and  oriented to person, place, and time.     Cranial Nerves: No cranial nerve deficit.     Sensory: No sensory deficit.     Motor: No weakness.     Coordination: Coordination normal.     Gait: Gait normal.     Deep Tendon Reflexes: Reflexes normal.  Psychiatric:        Mood and Affect: Mood normal.        Behavior: Behavior normal.        Thought Content: Thought content normal.        Judgment: Judgment normal.      No results found for any visits on 01/02/22.     Assessment & Plan:    Routine Health Maintenance and Physical Exam  Immunization History  Administered Date(s) Administered   Fluad Quad(high Dose 65+) 06/29/2019, 04/10/2021   Influenza,inj,Quad PF,6+ Mos 03/19/2017, 04/17/2020   Tdap 04/21/2017    Health Maintenance  Topic Date Due   COLONOSCOPY (Pts 45-31yrs Insurance coverage will need to be confirmed)  Never done   Zoster Vaccines- Shingrix (1 of 2) Never done   Pneumonia Vaccine 46+ Years old (1 - PCV) Never done   INFLUENZA VACCINE  02/04/2022   TETANUS/TDAP  04/22/2027   Hepatitis C Screening  Completed   HPV VACCINES  Aged Out   COVID-19 Vaccine  Discontinued    Discussed health benefits of physical activity, and encouraged him to engage in regular exercise appropriate for his age and condition.  Problem List Items Addressed This Visit       Genitourinary   Benign prostatic hyperplasia with weak urinary stream    Chronic condition  Takes saw palmetto Start flomax 0.4mg  daily  Check UA, testosterone, PSA  Will refer to urology if no improvement in 2 months       Relevant Medications   tamsulosin (FLOMAX) 0.4 MG CAPS capsule   Other Relevant Orders   PSA   Urinalysis   Testosterone,Free and Total     Other   Hypertriglyceridemia    Not taking statin but willing to restart statin , on asprin 81mg   Has had 2 CVA in the past  Need to be on a cholesterol lowering medication dicussed.  Check lipid panel, avoid fatty fried foods      Annual physical exam - Primary    Annual exam as documented.  Counseling done include healthy lifestyle involving committing to 150 minutes of exercise per week, heart healthy diet, and attaining healthy weight. The importance of adequate sleep also discussed.  Regular use of seat belt and home safety were also discussed . Changes in health habits are decided on by patient with goals and time frames set for achieving them. Immunization and cancer screening  needs are specifically addressed at this visit.  Patient encouraged to get his shingles and pneumonia vaccine at the pharmacy       Relevant Orders   PSA   Urinalysis   C-reactive protein   TSH   HgB A1c   Lipid Profile   Vitamin D (25 hydroxy)   Screening for colon cancer   Relevant Orders   Cologuard   Impaired vision in both eyes    Wears prescription glasses Due for yearly  eye exam Patient encouraged to follow-up with his ophthalmologist.      Return in about 2 months (around 03/04/2022) for BPH.     03/06/2022, FNP

## 2022-01-03 DIAGNOSIS — Z125 Encounter for screening for malignant neoplasm of prostate: Secondary | ICD-10-CM | POA: Diagnosis not present

## 2022-01-03 DIAGNOSIS — Z0000 Encounter for general adult medical examination without abnormal findings: Secondary | ICD-10-CM | POA: Diagnosis not present

## 2022-01-03 DIAGNOSIS — E781 Pure hyperglyceridemia: Secondary | ICD-10-CM | POA: Diagnosis not present

## 2022-01-03 DIAGNOSIS — R3912 Poor urinary stream: Secondary | ICD-10-CM | POA: Diagnosis not present

## 2022-01-03 DIAGNOSIS — N401 Enlarged prostate with lower urinary tract symptoms: Secondary | ICD-10-CM | POA: Diagnosis not present

## 2022-01-03 DIAGNOSIS — Z131 Encounter for screening for diabetes mellitus: Secondary | ICD-10-CM | POA: Diagnosis not present

## 2022-01-06 ENCOUNTER — Other Ambulatory Visit: Payer: Self-pay | Admitting: Nurse Practitioner

## 2022-01-06 ENCOUNTER — Encounter: Payer: Self-pay | Admitting: Nurse Practitioner

## 2022-01-06 DIAGNOSIS — I639 Cerebral infarction, unspecified: Secondary | ICD-10-CM

## 2022-01-06 DIAGNOSIS — G931 Anoxic brain damage, not elsewhere classified: Secondary | ICD-10-CM

## 2022-01-06 MED ORDER — ROSUVASTATIN CALCIUM 5 MG PO TABS: 5.0000 mg | ORAL_TABLET | Freq: Every day | ORAL | 3 refills | Status: DC

## 2022-01-06 NOTE — Progress Notes (Signed)
LDL not at goal of less than 55 due to his past history of stroke . Start taking crestor 5 mg daily , recheck labs in 8 weeks ,   Other labs are normal

## 2022-01-06 NOTE — Progress Notes (Signed)
Fasting labs 3-5 days before upcoming appointment

## 2022-01-08 ENCOUNTER — Telehealth: Payer: Self-pay

## 2022-01-08 NOTE — Telephone Encounter (Signed)
Spoke with pt he will come pick up lab results today results up front

## 2022-01-08 NOTE — Telephone Encounter (Signed)
Patient returning lab results call 

## 2022-01-09 LAB — LIPID PANEL
Chol/HDL Ratio: 3.1 ratio (ref 0.0–5.0)
Cholesterol, Total: 188 mg/dL (ref 100–199)
HDL: 60 mg/dL (ref >39)
LDL Chol Calc (NIH): 113 mg/dL — ABNORMAL HIGH (ref 0–99)
Triglycerides: 82 mg/dL (ref 0–149)
VLDL Cholesterol Cal: 15 mg/dL (ref 5–40)

## 2022-01-09 LAB — C-REACTIVE PROTEIN: CRP: 1 mg/L (ref 0–10)

## 2022-01-09 LAB — URINALYSIS
Bilirubin, UA: NEGATIVE
Glucose, UA: NEGATIVE
Ketones, UA: NEGATIVE
Leukocytes,UA: NEGATIVE
Nitrite, UA: NEGATIVE
Protein,UA: NEGATIVE
RBC, UA: NEGATIVE
Specific Gravity, UA: 1.012 (ref 1.005–1.030)
Urobilinogen, Ur: 0.2 mg/dL (ref 0.2–1.0)
pH, UA: 7 (ref 5.0–7.5)

## 2022-01-09 LAB — HEMOGLOBIN A1C
Est. average glucose Bld gHb Est-mCnc: 111 mg/dL
Hgb A1c MFr Bld: 5.5 % (ref 4.8–5.6)

## 2022-01-09 LAB — TESTOSTERONE,FREE AND TOTAL
Testosterone, Free: 16.9 pg/mL (ref 6.6–18.1)
Testosterone: 587 ng/dL (ref 264–916)

## 2022-01-09 LAB — TSH: TSH: 4.22 u[IU]/mL (ref 0.450–4.500)

## 2022-01-09 LAB — VITAMIN D 25 HYDROXY (VIT D DEFICIENCY, FRACTURES): Vit D, 25-Hydroxy: 38.3 ng/mL (ref 30.0–100.0)

## 2022-01-09 LAB — PSA: Prostate Specific Ag, Serum: 2.4 ng/mL (ref 0.0–4.0)

## 2022-01-14 DIAGNOSIS — G8929 Other chronic pain: Secondary | ICD-10-CM | POA: Diagnosis not present

## 2022-01-14 DIAGNOSIS — G4710 Hypersomnia, unspecified: Secondary | ICD-10-CM | POA: Diagnosis not present

## 2022-01-14 DIAGNOSIS — S069XAA Unspecified intracranial injury with loss of consciousness status unknown, initial encounter: Secondary | ICD-10-CM | POA: Diagnosis not present

## 2022-01-14 DIAGNOSIS — M545 Low back pain, unspecified: Secondary | ICD-10-CM | POA: Diagnosis not present

## 2022-01-14 DIAGNOSIS — I6930 Unspecified sequelae of cerebral infarction: Secondary | ICD-10-CM | POA: Diagnosis not present

## 2022-01-15 ENCOUNTER — Ambulatory Visit: Payer: Medicare HMO | Admitting: Orthopaedic Surgery

## 2022-01-15 ENCOUNTER — Encounter: Payer: Self-pay | Admitting: Orthopaedic Surgery

## 2022-01-15 VITALS — BP 122/86 | HR 63 | Ht 71.0 in | Wt 186.0 lb

## 2022-01-15 DIAGNOSIS — G8929 Other chronic pain: Secondary | ICD-10-CM | POA: Diagnosis not present

## 2022-01-15 DIAGNOSIS — M79672 Pain in left foot: Secondary | ICD-10-CM

## 2022-01-15 DIAGNOSIS — M7662 Achilles tendinitis, left leg: Secondary | ICD-10-CM | POA: Diagnosis not present

## 2022-01-15 MED ORDER — PREDNISONE 5 MG (21) PO TBPK: ORAL_TABLET | ORAL | 0 refills | Status: DC

## 2022-01-15 NOTE — Progress Notes (Signed)
My heel hurts  He has pain in the left heel over the distal Achilles tendon that has been present several months.  He has no redness or swelling just pain.  He has not had shoe wear changes.  He has tried Voltaren gel, heat, ice, NSAIDs with little help.  He has no trauma.  It is just not getting any better.  Left heel has tenderness at insertion of Achilles on the left but no defect, no swelling, no redness.  ROM is full but tender.    Encounter Diagnoses  Name Primary?   Chronic heel pain, left Yes   Left Achilles bursitis    I will begin prednisone dose pack.  His wife is in a facility in [place] for Alzheimer's disease and he will visit her the next three weeks there.  I will see him in one month.  He may need MRI.  He takes naprosyn daily for his back and he is to stop the naprosyn while on the prednisone.  Call if any problem.  Precautions discussed.  Electronically Signed Darreld Mclean, MD 7/12/202311:22 AM

## 2022-02-12 ENCOUNTER — Other Ambulatory Visit: Payer: Self-pay | Admitting: Nurse Practitioner

## 2022-02-12 ENCOUNTER — Telehealth: Payer: Self-pay | Admitting: Nurse Practitioner

## 2022-02-12 MED ORDER — EZETIMIBE 10 MG PO TABS: 10.0000 mg | ORAL_TABLET | Freq: Every day | ORAL | 0 refills | Status: DC

## 2022-02-12 NOTE — Telephone Encounter (Signed)
Please advise 

## 2022-02-12 NOTE — Telephone Encounter (Signed)
Patient came by office in regard to   rosuvastatin (CRESTOR) 5 MG tablet   Patient states that he can not take CRESTOR medication makes body hurt all over. Patient has stopped taking med .    Patient also has not started taking tamsulosin (FLOMAX) 0.4 MG CAPS capsule    Patient is looking in to med , has seen that medication can cause eye issues that may cause patient to be ineligible for cataract surgery.  Patient will be having surgery in the future.  Patient would like a call back.

## 2022-02-13 ENCOUNTER — Telehealth: Payer: Self-pay

## 2022-02-13 NOTE — Telephone Encounter (Signed)
Called pt to let him know that [facility] sent in rx no answer left vm

## 2022-02-13 NOTE — Telephone Encounter (Signed)
FYI, Patient called said he was trying to stay away from medication He will not be taking this medication.  ezetimibe (ZETIA) 10 MG tablet

## 2022-02-13 NOTE — Telephone Encounter (Signed)
FYI

## 2022-02-14 NOTE — Telephone Encounter (Signed)
Spoke with pt he states that he is picking back up on his physical activity and he does not want to do this medication he states that in the future he may consider it but not right now

## 2022-02-20 ENCOUNTER — Telehealth: Payer: Self-pay | Admitting: Orthopaedic Surgery

## 2022-02-20 NOTE — Telephone Encounter (Signed)
Patient stopped here today to ask for the contact information for [employer] for his orthotics which he said Dr Hilda Lias recommended. Gave contact information to patient.

## 2022-03-04 ENCOUNTER — Ambulatory Visit: Payer: Medicare HMO | Admitting: Internal Medicine

## 2022-03-05 ENCOUNTER — Ambulatory Visit: Payer: Medicare HMO | Admitting: Orthopaedic Surgery

## 2022-04-11 DIAGNOSIS — R430 Anosmia: Secondary | ICD-10-CM | POA: Diagnosis not present

## 2022-04-11 DIAGNOSIS — R439 Unspecified disturbances of smell and taste: Secondary | ICD-10-CM | POA: Diagnosis not present

## 2022-04-11 DIAGNOSIS — Z20822 Contact with and (suspected) exposure to covid-19: Secondary | ICD-10-CM | POA: Diagnosis not present

## 2022-05-09 ENCOUNTER — Ambulatory Visit (INDEPENDENT_AMBULATORY_CARE_PROVIDER_SITE_OTHER): Payer: Medicare HMO | Admitting: Internal Medicine

## 2022-05-09 ENCOUNTER — Encounter: Payer: Self-pay | Admitting: Internal Medicine

## 2022-05-09 VITALS — BP 127/74 | HR 56 | Ht 71.0 in | Wt 186.8 lb

## 2022-05-09 DIAGNOSIS — R3912 Poor urinary stream: Secondary | ICD-10-CM

## 2022-05-09 DIAGNOSIS — N401 Enlarged prostate with lower urinary tract symptoms: Secondary | ICD-10-CM

## 2022-05-09 DIAGNOSIS — G4710 Hypersomnia, unspecified: Secondary | ICD-10-CM | POA: Diagnosis not present

## 2022-05-09 DIAGNOSIS — L578 Other skin changes due to chronic exposure to nonionizing radiation: Secondary | ICD-10-CM

## 2022-05-09 DIAGNOSIS — I639 Cerebral infarction, unspecified: Secondary | ICD-10-CM

## 2022-05-09 DIAGNOSIS — Z0001 Encounter for general adult medical examination with abnormal findings: Secondary | ICD-10-CM | POA: Diagnosis not present

## 2022-05-09 MED ORDER — TRETINOIN 0.01 % EX GEL: Freq: Every day | CUTANEOUS | 0 refills | Status: DC

## 2022-05-09 NOTE — Assessment & Plan Note (Signed)
Returning to care today for follow-up.  He states that he has a Cologuard kit at home but has not completed it yet.  Plans to do so near future.  He is due for influenza and pneumococcal vaccination.  He believes that he has had these at his neurologist's office (Dr. Merlene Laughter).  He will contact their office to clarify. -Follow-up in 3 months for routine care

## 2022-05-09 NOTE — Patient Instructions (Signed)
It was a pleasure to see you today.  Thank you for giving [place] the opportunity to be involved in your care.  Below is a brief recap of your visit and next steps.  We will plan to see you again in 3 months.  Summary Things seems to be stable today. We will not make any medication changes today. We will follow up in 3 months

## 2022-05-09 NOTE — Assessment & Plan Note (Signed)
Prior history of CVA.  He is not currently on statin therapy.  He states he is not interested in taking statins and wants to manage his cholesterol with dietary modifications.  Lipid panel updated in June, LDL 113.  He states that at this time he was not eating like he should, but has returned to playing eating.  I expressed to Mr. Parmelee that statin therapy is indicated in the setting of a prior CVA.  He understands what continues to decline statin therapy at this time

## 2022-05-09 NOTE — Assessment & Plan Note (Signed)
He was previously prescribed Retin-A for treatment of sun-damaged skin and requests new prescription today.  Retin-A 0.01% gel prescribed today.

## 2022-05-09 NOTE — Assessment & Plan Note (Signed)
Previously prescribed Flomax for treatment of BPH, but states that he has not started the medication because he does not want to experience any adverse side effects of starting a new medication that could prevent him from traveling to [place] to visit his wife.  He states that he has filled the prescription and will start medication when appropriate.  He continues to endorse urinary symptoms.

## 2022-05-09 NOTE — Progress Notes (Signed)
Established Patient Office Visit  Subjective   Patient ID: Douglas Castille., male    DOB: 09-Apr-1956  Age: 66 y.o. MRN: [id number]  Chief Complaint  Patient presents with   Follow-up   Douglas Martin returns to care today.  He is a 66 year old male with a past medical history significant for hypertriglyceridemia, previous anoxic brain injury and CVA.  He was last seen at [facility] on 01/02/22 by Vena Rua, NP for his annual physical exam.  He endorsed symptoms of BPH and Flomax was started at that time.  Today Douglas Martin acute concern is distorted taste and smell that has been present since he had COVID-19 in February of this year.  He states that symptoms worsened last month when he had a different upper respiratory infection.  He wonders if his taste and smell never returned to normal.  He is otherwise asymptomatic without acute concerns.  Douglas Martin requests a new prescription for Retin-A gel, which she has previously used for treatment of sun damaged skin.  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    SMALL INTESTINE SURGERY     TONSILECTOMY, ADENOIDECTOMY, BILATERAL MYRINGOTOMY AND TUBES  1977   Social History   Tobacco Use   Smoking status: Former    Types: Cigars   Smokeless tobacco: Never  Vaping Use   Vaping Use: Never used  Substance Use  Topics   Alcohol use: No   Drug use: No   Family History  Problem Relation Age of Onset   Valvular heart disease Mother    HIV Father    Stroke Father    Suicidality Brother    Cancer Paternal Uncle    Cancer Paternal Grandmother    Prostate cancer Paternal Grandfather    Cancer - Lung Neg Hx    Allergies  Allergen Reactions   Ibuprofen Nausea And Vomiting   Keflex [Cephalexin] Rash    "Niacin-like Flush"   Review of Systems  Neurological:  Positive for sensory change (loss of taste / smell).  All other systems reviewed and are negative.     Objective:     BP 127/74   Pulse (!) 56   Ht _0  (1.803 m)   Wt 186 lb 12.8 oz (84.7 kg)   SpO2 98%   BMI 26.05 kg/m  BP Readings from Last 3 Encounters:  05/09/22 127/74  01/15/22 122/86  01/02/22 132/82      Physical Exam Vitals reviewed.  Constitutional:      General: He is not in acute distress.    Appearance: Normal appearance. He is not ill-appearing.  HENT:     Head: Normocephalic and atraumatic.     Right Ear: External ear normal.     Left  Ear: External ear normal.     Nose: Nose normal. No congestion or rhinorrhea.     Mouth/Throat:     Mouth: Mucous membranes are moist.     Pharynx: Oropharynx is clear.  Eyes:     General: No scleral icterus.    Extraocular Movements: Extraocular movements intact.     Conjunctiva/sclera: Conjunctivae normal.     Pupils: Pupils are equal, round, and reactive to light.  Cardiovascular:     Rate and Rhythm: Normal rate and regular rhythm.     Pulses: Normal pulses.     Heart sounds: Normal heart sounds. No murmur heard. Pulmonary:     Effort: Pulmonary effort is normal.     Breath sounds: Normal breath sounds. No wheezing, rhonchi or rales.  Abdominal:     General: Abdomen is flat. Bowel sounds are normal. There is no distension.     Palpations: Abdomen is soft.     Tenderness: There is no abdominal tenderness.  Musculoskeletal:        General: No swelling or  deformity. Normal range of motion.     Cervical back: Normal range of motion.  Skin:    General: Skin is warm and dry.     Capillary Refill: Capillary refill takes less than 2 seconds.  Neurological:     General: No focal deficit present.     Mental Status: He is alert and oriented to person, place, and time.     Motor: No weakness.  Psychiatric:        Mood and Affect: Mood normal.        Behavior: Behavior normal.        Thought Content: Thought content normal.    Last CBC Lab Results  Component Value Date   WBC 6.8 06/07/2021   HGB 14.6 06/07/2021   HCT 44.8 06/07/2021   MCV 91 06/07/2021   MCH 29.8 06/07/2021   RDW 12.3 06/07/2021   PLT 348 16/07/930   Last metabolic panel Lab Results  Component Value Date   GLUCOSE 95 06/07/2021   NA 142 06/07/2021   K 5.0 06/07/2021   CL 102 06/07/2021   CO2 26 06/07/2021   BUN 16 06/07/2021   CREATININE 1.23 06/07/2021   EGFR 65 06/07/2021   CALCIUM 9.2 06/07/2021   PROT 6.4 06/07/2021   ALBUMIN 4.7 06/07/2021   LABGLOB 1.7 06/07/2021   AGRATIO 2.8 (H) 06/07/2021   BILITOT 0.3 06/07/2021   ALKPHOS 77 06/07/2021   AST 20 06/07/2021   ALT 18 06/07/2021   Last lipids Lab Results  Component Value Date   CHOL 188 01/03/2022   HDL 60 01/03/2022   LDLCALC 113 (H) 01/03/2022   TRIG 82 01/03/2022   CHOLHDL 3.1 01/03/2022   Last hemoglobin A1c Lab Results  Component Value Date   HGBA1C 5.5 01/03/2022   Last thyroid functions Lab Results  Component Value Date   TSH 4.220 01/03/2022   Last vitamin D Lab Results  Component Value Date   VD25OH 38.3 01/03/2022   Last vitamin B12 and Folate Lab Results  Component Value Date   TFTDDUKG25 427 04/21/2017     Assessment & Plan:   Problem List Items Addressed This Visit       Stroke [facility])    Prior history of CVA.  He is not currently on statin therapy.  He states he is not interested in taking statins and wants to manage his cholesterol with dietary modifications.   Lipid panel updated in June,  LDL 113.  He states that at this time he was not eating like he should, but has returned to playing eating.  I expressed to Douglas Martin that statin therapy is indicated in the setting of a prior CVA.  He understands what continues to decline statin therapy at this time      Sun-damaged skin - Primary    He was previously prescribed Retin-A for treatment of sun-damaged skin and requests new prescription today.  Retin-A 0.01% gel prescribed today.      Benign prostatic hyperplasia with weak urinary stream    Previously prescribed Flomax for treatment of BPH, but states that he has not started the medication because he does not want to experience any adverse side effects of starting a new medication that could prevent him from traveling to [place] to visit his wife.  He states that he has filled the prescription and will start medication when appropriate.  He continues to endorse urinary symptoms.      Encounter for general adult medical examination with abnormal findings    Returning to care today for follow-up.  He states that he has a Cologuard kit at home but has not completed it yet.  Plans to do so near future.  He is due for influenza and pneumococcal vaccination.  He believes that he has had these at his neurologist's office (Dr. Merlene Laughter).  He will contact their office to clarify. -Follow-up in 3 months for routine care       Return in about 3 months (around 08/09/2022).    Johnette Abraham, MD

## 2022-05-14 ENCOUNTER — Ambulatory Visit (INDEPENDENT_AMBULATORY_CARE_PROVIDER_SITE_OTHER): Payer: Medicare HMO

## 2022-05-14 DIAGNOSIS — G4710 Hypersomnia, unspecified: Secondary | ICD-10-CM | POA: Diagnosis not present

## 2022-05-14 DIAGNOSIS — I6930 Unspecified sequelae of cerebral infarction: Secondary | ICD-10-CM | POA: Diagnosis not present

## 2022-05-14 DIAGNOSIS — Z23 Encounter for immunization: Secondary | ICD-10-CM

## 2022-05-14 DIAGNOSIS — M545 Low back pain, unspecified: Secondary | ICD-10-CM | POA: Diagnosis not present

## 2022-05-14 DIAGNOSIS — G8929 Other chronic pain: Secondary | ICD-10-CM | POA: Diagnosis not present

## 2022-05-15 ENCOUNTER — Telehealth: Payer: Self-pay | Admitting: Internal Medicine

## 2022-05-15 NOTE — Telephone Encounter (Signed)
Patient called said pharmacy needs provider approval before filling this medicine that was sent in yesterday.   tretinoin (RETIN-A) 0.01 % gel [[id number]]   pharmacy: [facility]

## 2022-05-15 NOTE — Telephone Encounter (Signed)
Patient didn't answer return call.

## 2022-05-16 NOTE — Telephone Encounter (Signed)
LVM to call office.

## 2022-05-16 NOTE — Telephone Encounter (Signed)
Called both numbers and no answer from either.

## 2022-08-11 ENCOUNTER — Ambulatory Visit: Payer: Medicare HMO | Admitting: Internal Medicine

## 2022-09-22 ENCOUNTER — Encounter: Payer: Self-pay | Admitting: Internal Medicine

## 2022-09-22 ENCOUNTER — Ambulatory Visit (INDEPENDENT_AMBULATORY_CARE_PROVIDER_SITE_OTHER): Payer: Medicare HMO | Admitting: Internal Medicine

## 2022-09-22 VITALS — BP 126/76 | HR 64 | Ht 71.0 in | Wt 185.0 lb

## 2022-09-22 DIAGNOSIS — M549 Dorsalgia, unspecified: Secondary | ICD-10-CM

## 2022-09-22 DIAGNOSIS — R3912 Poor urinary stream: Secondary | ICD-10-CM

## 2022-09-22 DIAGNOSIS — G8929 Other chronic pain: Secondary | ICD-10-CM

## 2022-09-22 DIAGNOSIS — G4710 Hypersomnia, unspecified: Secondary | ICD-10-CM | POA: Diagnosis not present

## 2022-09-22 DIAGNOSIS — E781 Pure hyperglyceridemia: Secondary | ICD-10-CM | POA: Diagnosis not present

## 2022-09-22 DIAGNOSIS — Z0001 Encounter for general adult medical examination with abnormal findings: Secondary | ICD-10-CM | POA: Diagnosis not present

## 2022-09-22 DIAGNOSIS — N401 Enlarged prostate with lower urinary tract symptoms: Secondary | ICD-10-CM

## 2022-09-22 DIAGNOSIS — Z139 Encounter for screening, unspecified: Secondary | ICD-10-CM

## 2022-09-22 DIAGNOSIS — Z131 Encounter for screening for diabetes mellitus: Secondary | ICD-10-CM | POA: Diagnosis not present

## 2022-09-22 DIAGNOSIS — Z1329 Encounter for screening for other suspected endocrine disorder: Secondary | ICD-10-CM

## 2022-09-22 DIAGNOSIS — I639 Cerebral infarction, unspecified: Secondary | ICD-10-CM | POA: Diagnosis not present

## 2022-09-22 DIAGNOSIS — H9193 Unspecified hearing loss, bilateral: Secondary | ICD-10-CM | POA: Diagnosis not present

## 2022-09-22 DIAGNOSIS — Z0000 Encounter for general adult medical examination without abnormal findings: Secondary | ICD-10-CM

## 2022-09-22 DIAGNOSIS — Z1321 Encounter for screening for nutritional disorder: Secondary | ICD-10-CM

## 2022-09-22 DIAGNOSIS — Z1211 Encounter for screening for malignant neoplasm of colon: Secondary | ICD-10-CM

## 2022-09-22 NOTE — Patient Instructions (Signed)
It was a pleasure to see you today.  Thank you for giving [place] the opportunity to be involved in your care.  Below is a brief recap of your visit and next steps.  We will plan to see you again in 3 months.  Summary No medication changes today Audiology referral placed for hearing assessment Repeat labs ordered Follow up in 3 months

## 2022-09-22 NOTE — Assessment & Plan Note (Signed)
Prior history of CVA.  He is currently prescribed ASA 81 mg daily.  Has previously declined statin therapy.  He is also prescribed Adderall to assist with focus when driving long distances. -No medication changes today -Repeat labs, including lipid panel have been ordered

## 2022-09-22 NOTE — Progress Notes (Signed)
Subjective:   Douglas Martin. is a 67 y.o. male who presents for Medicare Annual/Subsequent preventive examination.  Review of Systems    Review of Systems  All other systems reviewed and are negative.    Objective:    Today's Vitals   09/22/22 1419 09/22/22 1435  BP: 126/76   Pulse: 64   SpO2: 97%   Weight: 185 lb (83.9 kg)   Height: 5\' 11"  (1.803 m)   PainSc:  0-No pain   Body mass index is 25.8 kg/m.     09/22/2022    2:41 PM 08/26/2021    1:26 PM  Advanced Directives  Does Patient Have a Medical Advance Directive? Yes Yes  Type of Corporate treasurer of [place];Living will;Out of facility DNR (pink MOST or yellow form)  Copy of [place] in Chart?  No - copy requested    Current Medications (verified) Outpatient Encounter Medications as of 09/22/2022  Medication Sig   Acetylcysteine (NAC) 600 MG CAPS Take 600 mg by mouth once.   amphetamine-dextroamphetamine (ADDERALL) 10 MG tablet Take 0.5 tablets (5 mg total) by mouth daily.   ascorbic acid (VITAMIN C) 500 MG tablet Take 500 mg by mouth daily.   aspirin EC 81 MG tablet Take 81 mg by mouth daily.   Astaxanthin 4 MG CAPS Take 4 mg by mouth daily.   b complex vitamins capsule Take 1 capsule by mouth daily.   Cholecalciferol (VITAMIN D3) 50 MCG (2000 UT) CAPS Take by mouth. Takes daily   magnesium oxide (MAG-OX) 400 MG tablet Take 400 mg by mouth daily.   naproxen (NAPROSYN) 250 MG tablet Take 1 tablet (250 mg total) by mouth as needed.   Omega-3 1000 MG CAPS Take 1,400 mg by mouth daily.   traMADol (ULTRAM) 50 MG tablet 25 mg as needed.   tretinoin (RETIN-A) 0.01 % gel Apply topically at bedtime.   [DISCONTINUED] Lecithin 1200 MG CAPS Take 1,200 mg by mouth daily.   [DISCONTINUED] tamsulosin (FLOMAX) 0.4 MG CAPS capsule Take 1 capsule (0.4 mg total) by mouth daily.   No facility-administered encounter medications on file as of 09/22/2022.    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    SMALL INTESTINE SURGERY     TONSILECTOMY, ADENOIDECTOMY, BILATERAL MYRINGOTOMY AND TUBES  1977   Family History  Problem Relation Age of Onset   Valvular heart disease Mother    HIV Father    Stroke Father    Suicidality Brother    Cancer Paternal Uncle    Cancer Paternal Grandmother    Prostate cancer Paternal Grandfather    Cancer - Lung Neg Hx    Social History   Socioeconomic History   Marital status: Married    Spouse name: Jenny Reichmann  Number of children: 0   Years of education: Not on file   Highest education level: GED or equivalent  Occupational History   Occupation: Retired to take care of his parents in 2013; has worked in Corporate treasurer as hospital EMT    Comment: had mail order business, Psychologist, 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 Exercise. Eats all food groups.    Takes care of parents, who lives with him.   Exercise.Loves to cycle.   Eats all food groups.    Wears seat belt.    Anoxic brain injury s/p surgery   Married.    No children.    Social Determinants of Health    Financial Resource Strain: Low Risk  (08/26/2021)   Overall Financial Resource Strain (CARDIA)    Difficulty of Paying Living Expenses: Not hard at all  Food Insecurity: No Food Insecurity (08/26/2021)   Hunger Vital Sign    Worried About Running Out of Food in the Last Year: Never true    Ran Out of Food in the Last Year: Never true  Transportation Needs: No Transportation Needs (08/26/2021)   PRAPARE - Hydrologist (Medical): No    Lack of Transportation (Non-Medical): No  Physical Activity: Sufficiently Active (08/26/2021)   Exercise Vital Sign    Days of Exercise per Week: 7 days    Minutes of Exercise per Session: 120 min  Stress: No Stress Concern Present (08/26/2021)   [place]    Feeling of Stress : Not at all  Social Connections: Moderately Integrated (08/26/2021)   Social Connection and Isolation Panel [NHANES]    Frequency of Communication with Friends and Family: More than three times a week    Frequency of Social Gatherings with Friends and Family: Never    Attends Religious Services: More than 4 times per year    Active Member of [employer] or Organizations: No    Attends Music therapist: Never    Marital Status: Married    Tobacco Counseling Counseling given: Not Answered   Clinical Intake:  Pre-visit preparation completed: Yes  Pain : 0-10 Pain Score: 0-No pain Pain Type: Chronic pain Pain Location: Back Pain Descriptors / Indicators: Aching     Nutritional Status: BMI 25 -29 Overweight Diabetes: No  How often do you need to have someone help you when you read instructions, pamphlets, or other written materials from your doctor or pharmacy?: 1 - Never  Diabetic?No    Activities of Daily Living    09/22/2022    2:29 PM  In your present state of health, do you have any difficulty performing the following activities:  Hearing? 0  Vision? 0   Difficulty concentrating or making decisions? 0  Walking or climbing stairs? 0  Dressing or bathing? 0  Doing errands, shopping? 0    Patient Care Team: Johnette Abraham, MD as PCP - General (Internal Medicine)  Indicate any recent Medical Services you may have received from other than [facility] providers in the past year (date may be approximate).  Assessment:   This is a routine wellness examination for [place].  Hearing/Vision screen No results found.  Dietary issues and exercise activities discussed:     Goals Addressed   None    Depression Screen    09/22/2022    2:37 PM 09/22/2022    2:25 PM 09/22/2022    2:24 PM 05/09/2022    2:59 PM 01/02/2022    2:33 PM 08/29/2021    1:23 PM 08/26/2021    1:10 PM  PHQ 2/9 Scores  PHQ - 2 Score 1 2 1  0 0 0 0  PHQ- 9 Score 2          Fall Risk    09/22/2022    2:37 PM 09/22/2022    2:29 PM 05/09/2022    2:58 PM 01/02/2022    2:33 PM 08/29/2021    1:23 PM  Fall Risk   Falls in the past year? 0 0 0 0 0  Number falls in past yr: 0 0 0 0 0  Injury with Fall? 0 0 0 0 0  Risk for fall due to : No Fall Risks  No Fall Risks No Fall Risks No Fall Risks  Follow up Falls evaluation completed  Falls evaluation completed Falls evaluation completed Falls evaluation completed    [place]:  Any stairs in or around the home? Yes  If so, are there any without handrails? Yes  Home free of loose throw rugs in walkways, pet beds, electrical cords, etc? Yes  Adequate lighting in your home to reduce risk of falls? Yes   ASSISTIVE DEVICES UTILIZED TO PREVENT FALLS:  Life alert? No  Use of a cane, walker or w/c? No  Grab bars in the bathroom? Yes  Shower chair or bench in shower? Yes  Elevated toilet seat or a handicapped toilet? Yes     Cognitive Function:        09/22/2022    2:44 PM 08/26/2021    1:31 PM  6CIT Screen  What Year? 0 points 0 points  What month? 0 points 0 points  What time? 0 points  0 points  Count back from 20 0 points 0 points  Months in reverse 0 points 0 points  Repeat phrase 0 points 0 points  Total Score 0 points 0 points    Immunizations Immunization History  Administered Date(s) Administered   Fluad Quad(high Dose 65+) 06/29/2019, 04/10/2021, 05/14/2022   Influenza,inj,Quad PF,6+ Mos 03/19/2017, 04/17/2020   Tdap 04/21/2017    TDAP status: Up to date  Flu Vaccine status: Up to date  Pneumococcal vaccine status: Up to date  Covid-19 vaccine status: Declined, Education has been provided regarding the importance of this vaccine but patient still declined. Advised may receive this vaccine at local pharmacy or Health Dept.or vaccine clinic. Aware to provide a copy of the vaccination record if obtained from local pharmacy or Health Dept. Verbalized acceptance and understanding.  Qualifies for Shingles Vaccine? Yes   Zostavax completed No   Shingrix Completed?: Yes  Screening Tests Health Maintenance  Topic Date Due   COLONOSCOPY (Pts 45-21yrs Insurance coverage will need to be confirmed)  Never done   Zoster Vaccines- Shingrix (1 of 2) Never done   Pneumonia Vaccine 87+ Years old (1 of 1 - PCV) Never done   Medicare Annual Wellness (AWV)  08/26/2022   DTaP/Tdap/Td (2 - Td or Tdap) 04/22/2027   INFLUENZA VACCINE  Completed   Hepatitis C Screening  Completed  HPV VACCINES  Aged Out   COVID-19 Vaccine  Discontinued    Health Maintenance  Health Maintenance Due  Topic Date Due   COLONOSCOPY (Pts 45-73yrs Insurance coverage will need to be confirmed)  Never done   Zoster Vaccines- Shingrix (1 of 2) Never done   Pneumonia Vaccine 55+ Years old (1 of 1 - PCV) Never done   Medicare Annual Wellness (AWV)  08/26/2022    Colorectal cancer screening: Type of screening: Cologuard. Patient has this at home.   Lung Cancer Screening: (Low Dose CT Chest recommended if Age 23-80 years, 30 pack-year currently smoking OR have quit w/in 15years.) does not  qualify.     Additional Screening:  Hepatitis C Screening: does not qualify; Completed 04/21/2017  Vision Screening: Recommended annual ophthalmology exams for early detection of glaucoma and other disorders of the eye. Is the patient up to date with their annual eye exam?  Yes  Who is the provider or what is the name of the office in which the patient attends annual eye exams? [facility] If pt is not established with a provider, would they like to be referred to a provider to establish care? No .   Dental Screening: Recommended annual dental exams for proper oral hygiene  Community Resource Referral / Chronic Care Management: CRR required this visit?  No   CCM required this visit?  No      Plan:     I have personally reviewed and noted the following in the patient's chart:   Medical and social history Use of alcohol, tobacco or illicit drugs  Current medications and supplements including opioid prescriptions. Patient is not currently taking opioid prescriptions. Functional ability and status Nutritional status Physical activity Advanced directives List of other physicians Hospitalizations, surgeries, and ER visits in previous 12 months Vitals Screenings to include cognitive, depression, and falls Referrals and appointments  In addition, I have reviewed and discussed with patient certain preventive protocols, quality metrics, and best practice recommendations. A written personalized care plan for preventive services as well as general preventive health recommendations were provided to patient.     Lorene Dy, MD   09/22/2022

## 2022-09-22 NOTE — Assessment & Plan Note (Signed)
Cologuard has previously been ordered and he has the kit at home.  States that he will complete and return the kit in the near future.  He was reminded that the kit will expire by the end of June.

## 2022-09-22 NOTE — Assessment & Plan Note (Signed)
He endorses chronic bilateral hearing loss.  Audiology referral placed today for hearing assessment.

## 2022-09-22 NOTE — Assessment & Plan Note (Signed)
Previously prescribed tramadol for as needed relief, which he reports needing to use roughly twice per month.

## 2022-09-22 NOTE — Assessment & Plan Note (Signed)
Previously prescribed Flomax but still has not started the medication.  He will start Flomax if his symptoms worsen.

## 2022-09-22 NOTE — Patient Instructions (Signed)
  Douglas Martin , Thank you for taking time to come for your Medicare Wellness Visit. I appreciate your ongoing commitment to your health goals. Please review the following plan we discussed and let me know if I can assist you in the future.   These are the goals we discussed:Patient would like to get his lipid panel normalized. In addition he would like to lower his BP. He is going to try to increase his activity.    This is a list of the screening recommended for you and due dates:  Health Maintenance  Topic Date Due   Colon Cancer Screening  Never done   Zoster (Shingles) Vaccine (1 of 2) Never done   Pneumonia Vaccine (1 of 1 - PCV) Never done   Medicare Annual Wellness Visit  08/26/2022   DTaP/Tdap/Td vaccine (2 - Td or Tdap) 04/22/2027   Flu Shot  Completed   Hepatitis C Screening: USPSTF Recommendation to screen - Ages 18-79 yo.  Completed   HPV Vaccine  Aged Out   COVID-19 Vaccine  Discontinued

## 2022-09-22 NOTE — Progress Notes (Signed)
Established Patient Office Visit  Subjective   Patient ID: Douglas Martin., male    DOB: 21-Jun-1956  Age: 67 y.o. MRN: [id number]  Chief Complaint  Patient presents with   Follow-up   Mr. Volesky returns to care today for routine follow-up.  He was last evaluated by me on 05/09/22 for routine care.  There have been no acute interval events.  Today Mr. Ericksen reports that his wife passed in late January.  He had been traveling to [place] multiple times every month to visit her and his life has been much different following her death.  He has been focusing on himself more and completing projects around his home that were previously collected.  His acute concerns today are requesting updated labs and requesting a referral to audiology for evaluation of bilateral hearing loss.  He reports undergoing a hearing assessment approximately 8 years ago and being told that he would need hearing aids.  Past Medical History:  Diagnosis Date   Brain damage due to hypoxia [facility])    Circulatory anomaly    anomaly in the [place]   Hypertriglyceridemia    Stroke (cerebrum) ([place])    Thrombophilia ([place])    from genetic screening by 22 and Me   Vision disturbance    Past Surgical History:  Procedure Laterality Date   CYST EXCISION  2012   jaw cyst that had grown into the bone of jaw   EYE SURGERY  1978   esotropia   LASIK Right [place] Right 1990   was unable to complete.    SMALL INTESTINE SURGERY     TONSILECTOMY, ADENOIDECTOMY, BILATERAL MYRINGOTOMY AND TUBES  1977   Social History   Tobacco Use   Smoking status: Former    Types: Cigars   Smokeless tobacco: Never  Vaping Use   Vaping Use: Never used  Substance Use Topics   Alcohol use: No   Drug use: No   Family History  Problem Relation Age of Onset   Valvular heart disease Mother    HIV Father    Stroke Father    Suicidality Brother    Cancer Paternal Uncle    Cancer  Paternal Grandmother    Prostate cancer Paternal Grandfather    Cancer - Lung Neg Hx    Allergies  Allergen Reactions   Ibuprofen Nausea And Vomiting   Keflex [Cephalexin] Rash    "Niacin-like Flush"   Review of Systems  HENT:  Positive for hearing loss (Bilateral).   All other systems reviewed and are negative.    Objective:     BP 126/76   Pulse 64   Ht 5\' 11"  (1.803 m)   Wt 185 lb (83.9 kg)   SpO2 97%   BMI 25.80 kg/m  BP Readings from Last 3 Encounters:  09/22/22 126/76  09/22/22 126/76  05/09/22 127/74   Physical Exam Vitals reviewed.  Constitutional:      General: He is not in acute distress.    Appearance: Normal appearance. He is not ill-appearing.  HENT:     Head: Normocephalic and atraumatic.     Right Ear: External ear normal.     Left Ear: External ear normal.     Nose: Nose normal. No congestion or rhinorrhea.     Mouth/Throat:     Mouth: Mucous membranes are moist.     Pharynx: Oropharynx is clear.  Eyes:  General: No scleral icterus.    Extraocular Movements: Extraocular movements intact.     Conjunctiva/sclera: Conjunctivae normal.     Pupils: Pupils are equal, round, and reactive to light.  Cardiovascular:     Rate and Rhythm: Normal rate and regular rhythm.     Pulses: Normal pulses.     Heart sounds: Normal heart sounds. No murmur heard. Pulmonary:     Effort: Pulmonary effort is normal.     Breath sounds: Normal breath sounds. No wheezing, rhonchi or rales.  Abdominal:     General: Abdomen is flat. Bowel sounds are normal. There is no distension.     Palpations: Abdomen is soft.     Tenderness: There is no abdominal tenderness.  Musculoskeletal:        General: No swelling or deformity. Normal range of motion.     Cervical back: Normal range of motion.  Skin:    General: Skin is warm and dry.     Capillary Refill: Capillary refill takes less than 2 seconds.  Neurological:     General: No focal deficit present.     Mental  Status: He is alert and oriented to person, place, and time.     Motor: No weakness.  Psychiatric:        Mood and Affect: Mood normal.        Behavior: Behavior normal.        Thought Content: Thought content normal.   Last CBC Lab Results  Component Value Date   WBC 6.8 06/07/2021   HGB 14.6 06/07/2021   HCT 44.8 06/07/2021   MCV 91 06/07/2021   MCH 29.8 06/07/2021   RDW 12.3 06/07/2021   PLT 348 [id number]   Last metabolic panel Lab Results  Component Value Date   GLUCOSE 95 06/07/2021   NA 142 06/07/2021   K 5.0 06/07/2021   CL 102 06/07/2021   CO2 26 06/07/2021   BUN 16 06/07/2021   CREATININE 1.23 06/07/2021   EGFR 65 06/07/2021   CALCIUM 9.2 06/07/2021   PROT 6.4 06/07/2021   ALBUMIN 4.7 06/07/2021   LABGLOB 1.7 06/07/2021   AGRATIO 2.8 (H) 06/07/2021   BILITOT 0.3 06/07/2021   ALKPHOS 77 06/07/2021   AST 20 06/07/2021   ALT 18 06/07/2021   Last lipids Lab Results  Component Value Date   CHOL 188 01/03/2022   HDL 60 01/03/2022   LDLCALC 113 (H) 01/03/2022   TRIG 82 01/03/2022   CHOLHDL 3.1 01/03/2022   Last hemoglobin A1c Lab Results  Component Value Date   HGBA1C 5.5 01/03/2022   Last thyroid functions Lab Results  Component Value Date   TSH 4.220 01/03/2022   Last vitamin D Lab Results  Component Value Date   VD25OH 38.3 01/03/2022   Last vitamin B12 and Folate Lab Results  Component Value Date   [id number] 04/21/2017    The ASCVD Risk score (Arnett DK, et al., 2019) failed to calculate for the following reasons:   The patient has a prior MI or stroke diagnosis    Assessment & Plan:   Problem List Items Addressed This Visit       Stroke [facility])    Prior history of CVA.  He is currently prescribed ASA 81 mg daily.  Has previously declined statin therapy.  He is also prescribed Adderall to assist with focus when driving long distances. -No medication changes today -Repeat labs, including lipid panel have been ordered       Bilateral hearing  loss    He endorses chronic bilateral hearing loss.  Audiology referral placed today for hearing assessment.      Benign prostatic hyperplasia with weak urinary stream    Previously prescribed Flomax but still has not started the medication.  He will start Flomax if his symptoms worsen.      Chronic back pain    Previously prescribed tramadol for as needed relief, which he reports needing to use roughly twice per month.      Screening for colon cancer    Cologuard has previously been ordered and he has the kit at home.  States that he will complete and return the kit in the near future.  He was reminded that the kit will expire by the end of June.      Return in about 3 months (around 12/23/2022).   Johnette Abraham, MD

## 2022-09-23 LAB — CMP14+EGFR
ALT: 21 IU/L (ref 0–44)
AST: 18 IU/L (ref 0–40)
Albumin/Globulin Ratio: 2.4 — ABNORMAL HIGH (ref 1.2–2.2)
Albumin: 4.7 g/dL (ref 3.9–4.9)
Alkaline Phosphatase: 77 IU/L (ref 44–121)
BUN/Creatinine Ratio: 18 (ref 10–24)
BUN: 21 mg/dL (ref 8–27)
Bilirubin Total: 0.3 mg/dL (ref 0.0–1.2)
CO2: 25 mmol/L (ref 20–29)
Calcium: 9.6 mg/dL (ref 8.6–10.2)
Chloride: 101 mmol/L (ref 96–106)
Creatinine, Ser: 1.2 mg/dL (ref 0.76–1.27)
Globulin, Total: 2 g/dL (ref 1.5–4.5)
Glucose: 84 mg/dL (ref 70–99)
Potassium: 5.2 mmol/L (ref 3.5–5.2)
Sodium: 141 mmol/L (ref 134–144)
Total Protein: 6.7 g/dL (ref 6.0–8.5)
eGFR: 66 mL/min/{1.73_m2} (ref >59)

## 2022-09-23 LAB — CBC WITH DIFFERENTIAL/PLATELET
Basophils Absolute: 0 10*3/uL (ref 0.0–0.2)
Basos: 1 %
EOS (ABSOLUTE): 0 10*3/uL (ref 0.0–0.4)
Eos: 1 %
Hematocrit: 46.2 % (ref 37.5–51.0)
Hemoglobin: 15.2 g/dL (ref 13.0–17.7)
Immature Grans (Abs): 0 10*3/uL (ref 0.0–0.1)
Immature Granulocytes: 0 %
Lymphocytes Absolute: 1.7 10*3/uL (ref 0.7–3.1)
Lymphs: 25 %
MCH: 30.4 pg (ref 26.6–33.0)
MCHC: 32.9 g/dL (ref 31.5–35.7)
MCV: 92 fL (ref 79–97)
Monocytes Absolute: 0.6 10*3/uL (ref 0.1–0.9)
Monocytes: 10 %
Neutrophils Absolute: 4.2 10*3/uL (ref 1.4–7.0)
Neutrophils: 63 %
Platelets: 335 10*3/uL (ref 150–450)
RBC: 5 x10E6/uL (ref 4.14–5.80)
RDW: 12.2 % (ref 11.6–15.4)
WBC: 6.6 10*3/uL (ref 3.4–10.8)

## 2022-09-23 LAB — TSH+FREE T4
Free T4: 1.22 ng/dL (ref 0.82–1.77)
TSH: 2.93 u[IU]/mL (ref 0.450–4.500)

## 2022-09-23 LAB — HEMOGLOBIN A1C
Est. average glucose Bld gHb Est-mCnc: 111 mg/dL
Hgb A1c MFr Bld: 5.5 % (ref 4.8–5.6)

## 2022-09-23 LAB — LIPID PANEL
Chol/HDL Ratio: 3 ratio (ref 0.0–5.0)
Cholesterol, Total: 193 mg/dL (ref 100–199)
HDL: 64 mg/dL (ref >39)
LDL Chol Calc (NIH): 109 mg/dL — ABNORMAL HIGH (ref 0–99)
Triglycerides: 111 mg/dL (ref 0–149)
VLDL Cholesterol Cal: 20 mg/dL (ref 5–40)

## 2022-09-23 LAB — B12 AND FOLATE PANEL
Folate: >20 ng/mL (ref >3.0)
Vitamin B-12: 1001 pg/mL (ref 232–1245)

## 2022-09-23 LAB — C-REACTIVE PROTEIN: CRP: 1 mg/L (ref 0–10)

## 2022-09-23 LAB — PSA: Prostate Specific Ag, Serum: 2.7 ng/mL (ref 0.0–4.0)

## 2022-09-23 LAB — VITAMIN D 25 HYDROXY (VIT D DEFICIENCY, FRACTURES): Vit D, 25-Hydroxy: 38 ng/mL (ref 30.0–100.0)

## 2022-09-24 LAB — TESTOSTERONE,FREE AND TOTAL
Testosterone, Free: 7.1 pg/mL (ref 6.6–18.1)
Testosterone: 378 ng/dL (ref 264–916)

## 2022-10-01 ENCOUNTER — Ambulatory Visit: Payer: Medicare HMO | Attending: Audiologist | Admitting: Audiologist

## 2022-10-01 DIAGNOSIS — H903 Sensorineural hearing loss, bilateral: Secondary | ICD-10-CM | POA: Diagnosis not present

## 2022-10-02 NOTE — Procedures (Signed)
  Outpatient Audiology and [place], [place]  [address] [phone]  AUDIOLOGICAL  EVALUATION  NAME: Douglas Martin.     DOB:   1955-12-03      MRN: [id number]                                                                                     DATE: 10/02/2022     REFERENT: Johnette Abraham, MD STATUS: Outpatient DIAGNOSIS: Sensorineural Hearing Loss    History: [name] was seen for an audiological evaluation.  [name] is receiving a hearing evaluation due to concerns for difficulty hearing. [name] has difficulty hearing at most times, people sound muffled. Years ago he had a hearing test in [place] and was told he needed hearing aids. This difficulty began suddenly getting worse after noise exposure during an MRI. No pain or pressure reported in either ear. Tinnitus present for many years in both ears sounding like a buzz. [name] has a history of noise exposure from firearms as well.  Medical history positive for stroke which is a risk factor for hearing loss. No other relevant case history reported.   Evaluation:  Otoscopy showed a clear view of the tympanic membranes, bilaterally Tympanometry results were consistent with normal middle ear function, bilaterally   Audiometric testing was completed using conventional audiometry with supraural transducer. Speech Recognition Thresholds were 25 dB in the right ear and 30 dB in the left ear. Word Recognition was  performed 40 dB SL, scored 100% in the right ear and 100% in the left ear. Pure tone thresholds show normal hearing 250-1.5kHz dropping to severe sensorineural hearing loss 3-8kHz bilaterally.   Results:  The test results were reviewed with [name]. He needs hearing aids for both ears. He will need to be fit by a professional and not use over the counter aids. He has a steeply sloping high frequency sensorineural hearing loss in both ears.    Recommendations: Amplification is necessary for both  ears. Hearing aids can be purchased from a variety of locations. See provided list for locations in the [place] area.    34 minutes spent testing and counseling on results.   Alfonse Alpers  Audiologist, Au.D., CCC-A 10/02/2022  8:45 AM  Cc: Johnette Abraham, MD

## 2022-10-31 IMAGING — CT CT CARDIAC CORONARY ARTERY CALCIUM SCORE
1 of 2 series · 6 of 20 positions shown, 8 images · non-contrast
Comparison: None.
COMPARISON: None.

Addendum:
EXAM:
OVER-READ INTERPRETATION  CT CHEST

The following report is an over-read performed by radiologist Dr.
Michala Numero [REDACTED] on 07/24/2021. This
over-read does not include interpretation of cardiac or coronary
anatomy or pathology. The coronary calcium score/coronary CTA
interpretation by the cardiologist is attached.
CLINICAL DATA: Cardiovascular Disease Risk stratification
Coronary Calcium Score
TECHNIQUE: A gated, non-contrast computed tomography scan of the heart was
performed using 3mm slice thickness. Axial images were analyzed on a
dedicated workstation. Calcium scoring of the coronary arteries was
performed using the Agatston method.

[Series 2: cascseq 3.0 b35f 70% · axial · 0.73mm/px · z∈[+1341,+1473]mm · 6 of 62 slices shown, 8 images]
[im 9/62  vessel]
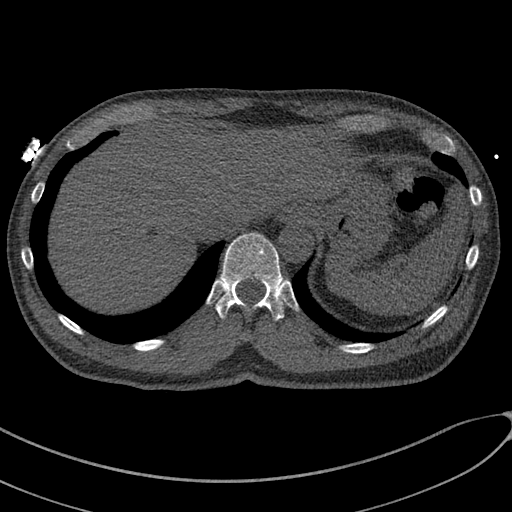
[im 9/62  lung]
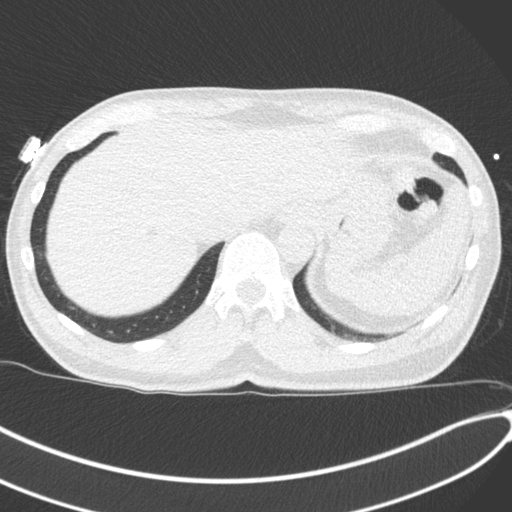
[im 18/62  vessel]
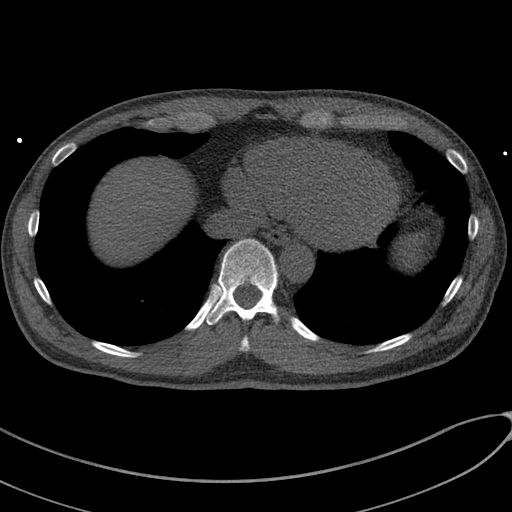
[im 27/62  vessel]
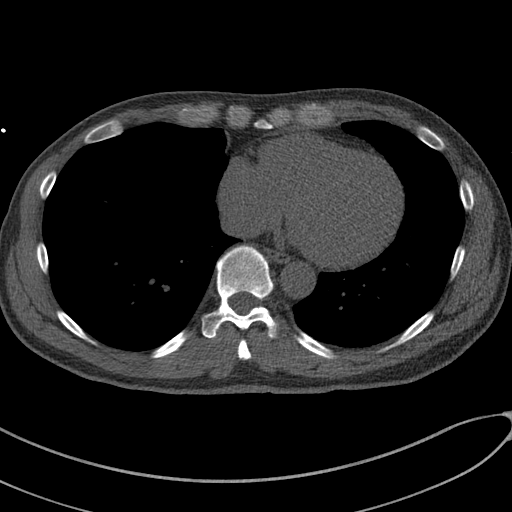
[im 35/62  vessel]
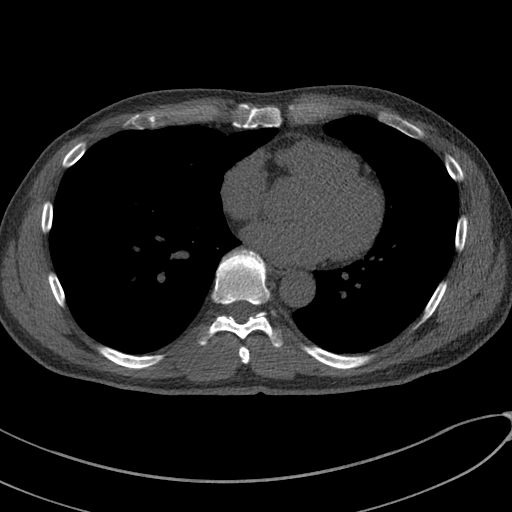
[im 44/62  vessel]
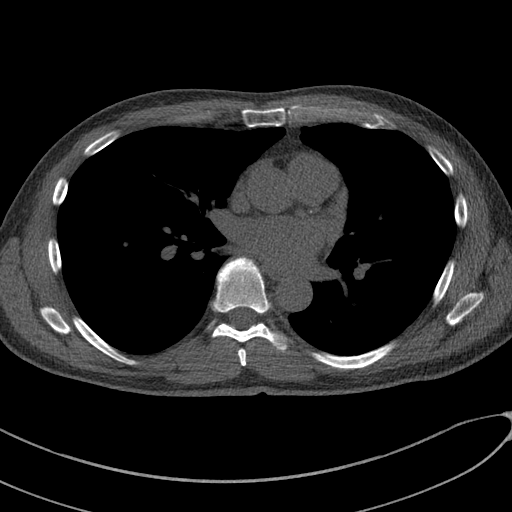
[im 44/62  lung]
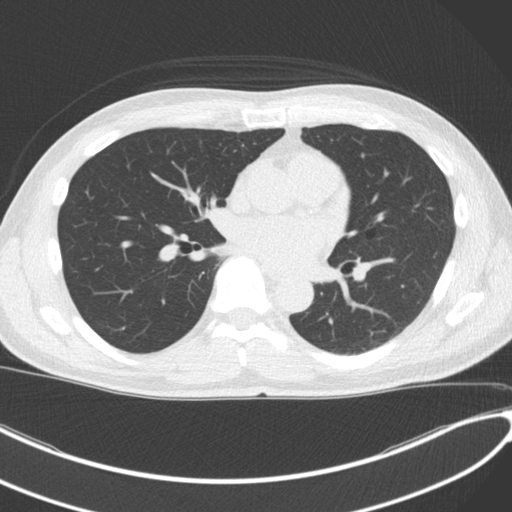
[im 53/62  vessel]
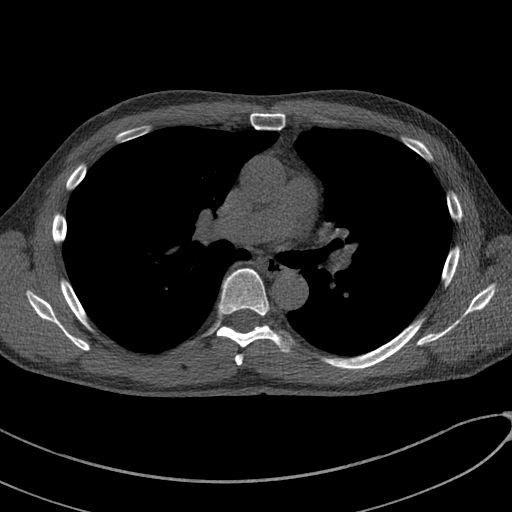

[6 of 20 positions shown; findings below may reference images not displayed]

FINDINGS: Vascular: None.

Mediastinum/Nodes: None.

Lungs/Pleura: No acute findings. Scarring in the right middle lobe.
No pleural fluid.

Upper Abdomen: Subcentimeter low-attenuation lesion in the right
hepatic lobe, too small to characterize. Visualized portions of the
liver, spleen, pancreas, stomach and bowel are otherwise
unremarkable.

Musculoskeletal: Mild degenerative changes in the spine.
IMPRESSION: No acute extracardiac findings.
FINDINGS: Coronary arteries: Normal origins.

Coronary Calcium Score:

Left main: 0

Left anterior descending artery: 4

Left circumflex artery: 0

Right coronary artery: 0

Total: 4

Percentile: 23

Pericardium: Normal.

Aorta: Normal caliber of ascending aorta. No aortic atherosclerosis
noted.

Non-cardiac: See separate report from [REDACTED].
IMPRESSION: Coronary calcium score of 4. This was 23rd percentile for age-,
race-, and sex-matched controls.



If CAC=0, it is reasonable to withhold statin therapy and reassess
in 5 to 10 years, as long as higher risk conditions are absent
(diabetes mellitus, family history of premature CHD in first degree
relatives (males <55 years; females <65 years), cigarette smoking,
or LDL >=190 mg/dL).

If CAC is 1 to 99, it is reasonable to initiate statin therapy for
patients >=55 years of age.

If CAC is >=100 or >=75th percentile, it is reasonable to initiate
statin therapy at any age.

Cardiology referral should be considered for patients with CAC
scores >=400 or >=75th percentile.

*3245 AHA/ACC/AACVPR/AAPA/ABC/MCMAURYS JR FONJU/IMUET/NKWE/Tiger/ERXLEBEN/SARABIA/FELY
Guideline on the Management of Blood Cholesterol: A Report of the
American College of Cardiology/American Heart Association Task Force
on Clinical Practice Guidelines. J Am Coll Cardiol.
6119;73(24):7948-7664.

*** End of Addendum ***
EXAM:
OVER-READ INTERPRETATION  CT CHEST

The following report is an over-read performed by radiologist Dr.
Michala Numero [REDACTED] on 07/24/2021. This
over-read does not include interpretation of cardiac or coronary
anatomy or pathology. The coronary calcium score/coronary CTA
interpretation by the cardiologist is attached.
FINDINGS: Vascular: None.

Mediastinum/Nodes: None.

Lungs/Pleura: No acute findings. Scarring in the right middle lobe.
No pleural fluid.

Upper Abdomen: Subcentimeter low-attenuation lesion in the right
hepatic lobe, too small to characterize. Visualized portions of the
liver, spleen, pancreas, stomach and bowel are otherwise
unremarkable.

Musculoskeletal: Mild degenerative changes in the spine.
IMPRESSION: No acute extracardiac findings.

## 2022-12-24 ENCOUNTER — Ambulatory Visit (INDEPENDENT_AMBULATORY_CARE_PROVIDER_SITE_OTHER): Payer: Medicare HMO | Admitting: Internal Medicine

## 2022-12-24 ENCOUNTER — Encounter: Payer: Self-pay | Admitting: Internal Medicine

## 2022-12-24 VITALS — BP 122/70 | HR 57 | Ht 71.0 in | Wt 185.4 lb

## 2022-12-24 DIAGNOSIS — E781 Pure hyperglyceridemia: Secondary | ICD-10-CM

## 2022-12-24 DIAGNOSIS — N401 Enlarged prostate with lower urinary tract symptoms: Secondary | ICD-10-CM | POA: Diagnosis not present

## 2022-12-24 DIAGNOSIS — E785 Hyperlipidemia, unspecified: Secondary | ICD-10-CM

## 2022-12-24 DIAGNOSIS — I639 Cerebral infarction, unspecified: Secondary | ICD-10-CM | POA: Diagnosis not present

## 2022-12-24 DIAGNOSIS — G8929 Other chronic pain: Secondary | ICD-10-CM

## 2022-12-24 DIAGNOSIS — R3912 Poor urinary stream: Secondary | ICD-10-CM | POA: Diagnosis not present

## 2022-12-24 DIAGNOSIS — H9193 Unspecified hearing loss, bilateral: Secondary | ICD-10-CM

## 2022-12-24 DIAGNOSIS — M7918 Myalgia, other site: Secondary | ICD-10-CM | POA: Diagnosis not present

## 2022-12-24 DIAGNOSIS — Z1211 Encounter for screening for malignant neoplasm of colon: Secondary | ICD-10-CM | POA: Diagnosis not present

## 2022-12-24 DIAGNOSIS — Z139 Encounter for screening, unspecified: Secondary | ICD-10-CM | POA: Diagnosis not present

## 2022-12-24 DIAGNOSIS — G4710 Hypersomnia, unspecified: Secondary | ICD-10-CM | POA: Diagnosis not present

## 2022-12-24 MED ORDER — NAPROXEN 250 MG PO TABS: 250.0000 mg | ORAL_TABLET | ORAL | 3 refills | Status: DC | PRN

## 2022-12-24 NOTE — Assessment & Plan Note (Signed)
He is currently prescribed naproxen and tramadol for as needed use in the setting of chronic musculoskeletal pain.  He reports taking tramadol roughly 3-4 times per month.  His prescription was last filled November 2023. -Controlled substance agreement signed today -Will refill tramadol when appropriate

## 2022-12-24 NOTE — Assessment & Plan Note (Signed)
Reports that he still is not started taking Flomax until he follows up with his ophthalmologist as he is concerned that he may need to undergo cataract surgery

## 2022-12-24 NOTE — Assessment & Plan Note (Signed)
He has establish care with audiology and undergone assessment.  Hearing aids recommended.  Reports today that he has been fitted for hearing aids.

## 2022-12-24 NOTE — Assessment & Plan Note (Signed)
Cologuard kit still has not been completed.  The kit will expire at the end of this month.  I again reviewed the options for colon cancer screening with Douglas Martin.  His preference remains Cologuard.  He will try to complete his current kit before it expires at the end of the month.

## 2022-12-24 NOTE — Assessment & Plan Note (Signed)
History of CVA.  Remains on ASA 81 mg daily.  Continues to decline statin therapy but requests repeat labs today. -I again reviewed the indications for statin therapy with Mr. Douglas Martin.  He is aware of the indications but has made the informed decision to again decline starting statin therapy. -Continue ASA 81 mg daily

## 2022-12-24 NOTE — Assessment & Plan Note (Signed)
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  He states that he seldom has to take Adderall.  His prescription was last filled in May 2023.  States that he will likely need a refill within the next 6 months. -Controlled substance agreement signed today -Will refill Adderall when needed

## 2022-12-24 NOTE — Progress Notes (Signed)
Established Patient Office Visit  Subjective   Patient ID: Douglas Sarazin., male    DOB: [birth year]  Age: 67 y.o. MRN: [id number]  Chief Complaint  Patient presents with   Follow-up    Follow up   Mr. Douglas Martin returns to care today for routine follow-up.  He was last evaluated by me on 3/18.  At that time he was referred to audiology for hearing assessment in the setting of chronic bilateral hearing loss.  No medication changes were made and 18-month follow-up was arranged.  In the interim he has establish care with audiology.  Hearing aids have been recommended.  There have otherwise been no acute interval events.  Mr. Douglas Martin reports feeling well today.  He is asymptomatic currently.  He states that he has significant increased his physical activity over the last 3 months.  He requests to have all labs repeated today.  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    SMALL INTESTINE SURGERY     TONSILECTOMY, ADENOIDECTOMY, BILATERAL MYRINGOTOMY AND TUBES  1977   Social History   Tobacco Use   Smoking status: Former    Types: Cigars   Smokeless tobacco: Never  Vaping Use   Vaping Use: Never used  Substance Use Topics   Alcohol use: No   Drug use: No   Family History  Problem Relation Age of Onset   Valvular heart disease Mother    HIV Father    Stroke Father    Suicidality Brother    Cancer Paternal Uncle    Cancer Paternal Grandmother    Prostate cancer Paternal Grandfather    Cancer -  Lung Neg Hx    Allergies  Allergen Reactions   Ibuprofen Nausea And Vomiting   Keflex [Cephalexin] Rash    "Niacin-like Flush"   Review of Systems  Musculoskeletal:  Positive for joint pain (left shoulder and left hip pain).     Objective:     BP 122/70   Pulse (!) 57   Ht 5\' 11"  (1.803 m)   Wt 185 lb 6.4 oz (84.1 kg)   SpO2 98%   BMI 25.86 kg/m  BP Readings from Last 3 Encounters:  12/24/22 122/70  09/22/22 126/76  09/22/22 126/76   Physical Exam Vitals reviewed.  Constitutional:      General: He is not in acute distress.    Appearance: Normal appearance. He is not ill-appearing.  HENT:     Head: Normocephalic and atraumatic.     Right Ear: External ear normal.     Left Ear: External ear normal.     Nose: Nose normal. No congestion or rhinorrhea.     Mouth/Throat:     Mouth: Mucous membranes are moist.     Pharynx: Oropharynx is clear.  Eyes:     General: No scleral icterus.    Extraocular  Movements: Extraocular movements intact.     Conjunctiva/sclera: Conjunctivae normal.     Pupils: Pupils are equal, round, and reactive to light.  Cardiovascular:     Rate and Rhythm: Normal rate and regular rhythm.     Pulses: Normal pulses.     Heart sounds: Normal heart sounds. No murmur heard. Pulmonary:     Effort: Pulmonary effort is normal.     Breath sounds: Normal breath sounds. No wheezing, rhonchi or rales.  Abdominal:     General: Abdomen is flat. Bowel sounds are normal. There is no distension.     Palpations: Abdomen is soft.     Tenderness: There is no abdominal tenderness.  Musculoskeletal:        General: No swelling or deformity. Normal range of motion.     Cervical back: Normal range of motion.  Skin:    General: Skin is warm and dry.     Capillary Refill: Capillary refill takes less than 2 seconds.  Neurological:     General: No focal deficit present.     Mental Status: He is alert and oriented to person, place, and time.     Motor: No weakness.   Psychiatric:        Mood and Affect: Mood normal.        Behavior: Behavior normal.        Thought Content: Thought content normal.   Last CBC Lab Results  Component Value Date   WBC 6.6 09/22/2022   HGB 15.2 09/22/2022   HCT 46.2 09/22/2022   MCV 92 09/22/2022   MCH 30.4 09/22/2022   RDW 12.2 09/22/2022   PLT 335 09/22/2022   Last metabolic panel Lab Results  Component Value Date   GLUCOSE 84 09/22/2022   NA 141 09/22/2022   K 5.2 09/22/2022   CL 101 09/22/2022   CO2 25 09/22/2022   BUN 21 09/22/2022   CREATININE 1.20 09/22/2022   EGFR 66 09/22/2022   CALCIUM 9.6 09/22/2022   PROT 6.7 09/22/2022   ALBUMIN 4.7 09/22/2022   LABGLOB 2.0 09/22/2022   AGRATIO 2.4 (H) 09/22/2022   BILITOT 0.3 09/22/2022   ALKPHOS 77 09/22/2022   AST 18 09/22/2022   ALT 21 09/22/2022   Last lipids Lab Results  Component Value Date   CHOL 193 09/22/2022   HDL 64 09/22/2022   LDLCALC 109 (H) 09/22/2022   TRIG 111 09/22/2022   CHOLHDL 3.0 09/22/2022   Last hemoglobin A1c Lab Results  Component Value Date   HGBA1C 5.5 09/22/2022   Last thyroid functions Lab Results  Component Value Date   TSH 2.930 09/22/2022   Last vitamin D Lab Results  Component Value Date   VD25OH 38.0 09/22/2022   Last vitamin B12 and Folate Lab Results  Component Value Date   VITAMINB12 1,001 09/22/2022   FOLATE >20.0 09/22/2022     Assessment & Plan:   Problem List Items Addressed This Visit       Stroke [facility]) - Primary    History of CVA.  Remains on ASA 81 mg daily.  Continues to decline statin therapy but requests repeat labs today. -I again reviewed the indications for statin therapy with Mr. Douglas Martin.  He is aware of the indications but has made the informed decision to again decline starting statin therapy. -Continue ASA 81 mg daily      Bilateral hearing loss    He has establish care with audiology and undergone assessment.  Hearing aids recommended.  Reports today  that he has been  fitted for hearing aids.      Benign prostatic hyperplasia with weak urinary stream    Reports that he still is not started taking Flomax until he follows up with his ophthalmologist as he is concerned that he may need to undergo cataract surgery      Excessive sleepiness    He is currently prescribed Adderall 5 mg as needed while driving.  He states that he seldom has to take Adderall.  His prescription was last filled in May 2023.  States that he will likely need a refill within the next 6 months. -Controlled substance agreement signed today -Will refill Adderall when needed      Screening for colon cancer    Cologuard kit still has not been completed.  The kit will expire at the end of this month.  I again reviewed the options for colon cancer screening with Mr. Douglas Martin.  His preference remains Cologuard.  He will try to complete his current kit before it expires at the end of the month.      Chronic musculoskeletal pain    He is currently prescribed naproxen and tramadol for as needed use in the setting of chronic musculoskeletal pain.  He reports taking tramadol roughly 3-4 times per month.  His prescription was last filled November 2023. -Controlled substance agreement signed today -Will refill tramadol when appropriate      Return in about 6 months (around 06/25/2023).   Billie Lade, MD

## 2022-12-24 NOTE — Patient Instructions (Signed)
It was a pleasure to see you today.  Thank you for giving [place] the opportunity to be involved in your care.  Below is a brief recap of your visit and next steps.  We will plan to see you again in 6 months.  Summary No medication changes today Repeat labs ordered Follow up in 6 months

## 2022-12-25 ENCOUNTER — Other Ambulatory Visit: Payer: Self-pay

## 2022-12-25 DIAGNOSIS — G4710 Hypersomnia, unspecified: Secondary | ICD-10-CM

## 2022-12-25 LAB — CBC WITH DIFFERENTIAL/PLATELET
Basophils Absolute: 0.1 10*3/uL (ref 0.0–0.2)
Basos: 1 %
EOS (ABSOLUTE): 0.1 10*3/uL (ref 0.0–0.4)
Eos: 1 %
Hematocrit: 46.3 % (ref 37.5–51.0)
Hemoglobin: 14.9 g/dL (ref 13.0–17.7)
Immature Grans (Abs): 0 10*3/uL (ref 0.0–0.1)
Immature Granulocytes: 0 %
Lymphocytes Absolute: 1.5 10*3/uL (ref 0.7–3.1)
Lymphs: 27 %
MCH: 29.7 pg (ref 26.6–33.0)
MCHC: 32.2 g/dL (ref 31.5–35.7)
MCV: 92 fL (ref 79–97)
Monocytes Absolute: 0.6 10*3/uL (ref 0.1–0.9)
Monocytes: 11 %
Neutrophils Absolute: 3.5 10*3/uL (ref 1.4–7.0)
Neutrophils: 60 %
Platelets: 295 10*3/uL (ref 150–450)
RBC: 5.02 x10E6/uL (ref 4.14–5.80)
RDW: 12.2 % (ref 11.6–15.4)
WBC: 5.8 10*3/uL (ref 3.4–10.8)

## 2022-12-25 LAB — B12 AND FOLATE PANEL
Folate: >20 ng/mL (ref >3.0)
Vitamin B-12: 1106 pg/mL (ref 232–1245)

## 2022-12-25 LAB — CMP14+EGFR
ALT: 20 IU/L (ref 0–44)
AST: 21 IU/L (ref 0–40)
Albumin: 4.6 g/dL (ref 3.9–4.9)
Alkaline Phosphatase: 85 IU/L (ref 44–121)
BUN/Creatinine Ratio: 16 (ref 10–24)
BUN: 19 mg/dL (ref 8–27)
Bilirubin Total: 0.4 mg/dL (ref 0.0–1.2)
CO2: 27 mmol/L (ref 20–29)
Calcium: 9.3 mg/dL (ref 8.6–10.2)
Chloride: 99 mmol/L (ref 96–106)
Creatinine, Ser: 1.17 mg/dL (ref 0.76–1.27)
Globulin, Total: 2.2 g/dL (ref 1.5–4.5)
Glucose: 77 mg/dL (ref 70–99)
Potassium: 5.6 mmol/L — ABNORMAL HIGH (ref 3.5–5.2)
Sodium: 141 mmol/L (ref 134–144)
Total Protein: 6.8 g/dL (ref 6.0–8.5)
eGFR: 68 mL/min/{1.73_m2} (ref >59)

## 2022-12-25 LAB — LIPID PANEL
Chol/HDL Ratio: 3 ratio (ref 0.0–5.0)
Cholesterol, Total: 189 mg/dL (ref 100–199)
HDL: 62 mg/dL (ref >39)
LDL Chol Calc (NIH): 110 mg/dL — ABNORMAL HIGH (ref 0–99)
Triglycerides: 95 mg/dL (ref 0–149)
VLDL Cholesterol Cal: 17 mg/dL (ref 5–40)

## 2022-12-25 LAB — HEMOGLOBIN A1C
Est. average glucose Bld gHb Est-mCnc: 108 mg/dL
Hgb A1c MFr Bld: 5.4 % (ref 4.8–5.6)

## 2022-12-25 LAB — TSH+FREE T4
Free T4: 1.09 ng/dL (ref 0.82–1.77)
TSH: 4.32 u[IU]/mL (ref 0.450–4.500)

## 2022-12-25 LAB — VITAMIN D 25 HYDROXY (VIT D DEFICIENCY, FRACTURES): Vit D, 25-Hydroxy: 42.4 ng/mL (ref 30.0–100.0)

## 2022-12-26 ENCOUNTER — Other Ambulatory Visit: Payer: Self-pay

## 2022-12-26 DIAGNOSIS — Z1211 Encounter for screening for malignant neoplasm of colon: Secondary | ICD-10-CM | POA: Diagnosis not present

## 2022-12-26 DIAGNOSIS — E875 Hyperkalemia: Secondary | ICD-10-CM

## 2022-12-26 LAB — C-REACTIVE PROTEIN: CRP: <1 mg/L (ref 0–10)

## 2022-12-26 LAB — PSA: Prostate Specific Ag, Serum: 2.3 ng/mL (ref 0.0–4.0)

## 2022-12-29 ENCOUNTER — Encounter: Payer: Medicare HMO | Admitting: Orthopedic Surgery

## 2022-12-29 DIAGNOSIS — N401 Enlarged prostate with lower urinary tract symptoms: Secondary | ICD-10-CM | POA: Diagnosis not present

## 2022-12-29 DIAGNOSIS — R3912 Poor urinary stream: Secondary | ICD-10-CM | POA: Diagnosis not present

## 2022-12-29 DIAGNOSIS — E875 Hyperkalemia: Secondary | ICD-10-CM | POA: Diagnosis not present

## 2022-12-30 LAB — BASIC METABOLIC PANEL
BUN/Creatinine Ratio: 15 (ref 10–24)
BUN: 17 mg/dL (ref 8–27)
CO2: 25 mmol/L (ref 20–29)
Calcium: 9.5 mg/dL (ref 8.6–10.2)
Chloride: 102 mmol/L (ref 96–106)
Creatinine, Ser: 1.12 mg/dL (ref 0.76–1.27)
Glucose: 58 mg/dL — ABNORMAL LOW (ref 70–99)
Potassium: 5.3 mmol/L — ABNORMAL HIGH (ref 3.5–5.2)
Sodium: 142 mmol/L (ref 134–144)
eGFR: 72 mL/min/{1.73_m2} (ref >59)

## 2023-01-01 ENCOUNTER — Encounter: Payer: Medicare HMO | Admitting: Orthopedic Surgery

## 2023-01-01 LAB — TESTOSTERONE,FREE AND TOTAL
Testosterone, Free: 5.5 pg/mL — ABNORMAL LOW (ref 6.6–18.1)
Testosterone: 430 ng/dL (ref 264–916)

## 2023-01-02 LAB — COLOGUARD
COLOGUARD: NEGATIVE
Cologuard: NEGATIVE

## 2023-01-05 ENCOUNTER — Encounter: Payer: Medicare HMO | Admitting: Orthopedic Surgery

## 2023-01-06 ENCOUNTER — Other Ambulatory Visit (INDEPENDENT_AMBULATORY_CARE_PROVIDER_SITE_OTHER): Payer: Medicare HMO

## 2023-01-06 ENCOUNTER — Ambulatory Visit: Payer: Medicare HMO | Admitting: Orthopedic Surgery

## 2023-01-06 ENCOUNTER — Encounter: Payer: Self-pay | Admitting: Orthopedic Surgery

## 2023-01-06 VITALS — BP 151/88 | HR 53 | Ht 71.0 in | Wt 185.0 lb

## 2023-01-06 DIAGNOSIS — M25512 Pain in left shoulder: Secondary | ICD-10-CM

## 2023-01-06 DIAGNOSIS — G8929 Other chronic pain: Secondary | ICD-10-CM

## 2023-01-06 DIAGNOSIS — M25552 Pain in left hip: Secondary | ICD-10-CM | POA: Diagnosis not present

## 2023-01-06 NOTE — Progress Notes (Signed)
Chief Complaint  Patient presents with   Hip Pain    Left    Shoulder Pain    Left     Hpi -  presents with left hip and left shoulder pain   Douglas Martin is an excellent athlete.  He complains of left hip and left shoulder pain which he describes as irritation  He has tried to rehab himself as he has some physical therapy work experience and he says he is at 90 to 95%.  He cut back on his running and can do most of those activities well  He gets most of his discomfort in the groin and lateral hip when he is walking  He also says that when he externally rotates and flexes his hip Mance when he gets the pain  He has no trouble biking or swimming  He is not taking any medication for his discomfort and there has been no trauma.  Symptoms may have started in his hip when he was hiking mountains with up to 65 pound weight vest  Problem list, medical hx, medications and allergies reviewed.  Physical Exam Vitals and nursing note reviewed.  Constitutional:      Appearance: Normal appearance.  HENT:     Head: Normocephalic and atraumatic.  Eyes:     General: No scleral icterus.       Right eye: No discharge.        Left eye: No discharge.     Extraocular Movements: Extraocular movements intact.     Conjunctiva/sclera: Conjunctivae normal.     Pupils: Pupils are equal, round, and reactive to light.  Cardiovascular:     Rate and Rhythm: Normal rate.     Pulses: Normal pulses.  Skin:    General: Skin is warm and dry.     Capillary Refill: Capillary refill takes less than 2 seconds.  Neurological:     General: No focal deficit present.     Mental Status: He is alert and oriented to person, place, and time.  Psychiatric:        Mood and Affect: Mood normal.        Behavior: Behavior normal.        Thought Content: Thought content normal.        Judgment: Judgment normal.    Encounter Diagnoses  Name Primary?   Chronic left shoulder pain Yes   Chronic left hip pain    Douglas Martin is doing  well I think with his level of function of 90 to 95% further management or workup would not be in his interest as he is functioning at such a high level  He is interested in trying a dose of prednisone which is fine most of his issues should be amenable to correction with activity modification  Follow-up as needed

## 2023-01-12 ENCOUNTER — Encounter: Payer: Medicare HMO | Admitting: Orthopedic Surgery

## 2023-01-15 DIAGNOSIS — H52223 Regular astigmatism, bilateral: Secondary | ICD-10-CM | POA: Diagnosis not present

## 2023-01-15 DIAGNOSIS — H5210 Myopia, unspecified eye: Secondary | ICD-10-CM | POA: Diagnosis not present

## 2023-01-15 DIAGNOSIS — H524 Presbyopia: Secondary | ICD-10-CM | POA: Diagnosis not present

## 2023-02-19 ENCOUNTER — Other Ambulatory Visit: Payer: Self-pay

## 2023-02-19 DIAGNOSIS — Z79899 Other long term (current) drug therapy: Secondary | ICD-10-CM

## 2023-04-07 DIAGNOSIS — X32XXXA Exposure to sunlight, initial encounter: Secondary | ICD-10-CM | POA: Diagnosis not present

## 2023-04-07 DIAGNOSIS — L570 Actinic keratosis: Secondary | ICD-10-CM | POA: Diagnosis not present

## 2023-04-07 DIAGNOSIS — Z1283 Encounter for screening for malignant neoplasm of skin: Secondary | ICD-10-CM | POA: Diagnosis not present

## 2023-04-07 DIAGNOSIS — I781 Nevus, non-neoplastic: Secondary | ICD-10-CM | POA: Diagnosis not present

## 2023-04-07 DIAGNOSIS — L820 Inflamed seborrheic keratosis: Secondary | ICD-10-CM | POA: Diagnosis not present

## 2023-06-25 ENCOUNTER — Ambulatory Visit: Payer: Medicare HMO | Admitting: Internal Medicine

## 2023-06-25 ENCOUNTER — Encounter: Payer: Self-pay | Admitting: Internal Medicine

## 2023-06-25 VITALS — BP 144/80 | HR 67 | Ht 71.0 in | Wt 189.0 lb

## 2023-06-25 DIAGNOSIS — E785 Hyperlipidemia, unspecified: Secondary | ICD-10-CM

## 2023-06-25 DIAGNOSIS — N401 Enlarged prostate with lower urinary tract symptoms: Secondary | ICD-10-CM

## 2023-06-25 DIAGNOSIS — E781 Pure hyperglyceridemia: Secondary | ICD-10-CM

## 2023-06-25 DIAGNOSIS — R3912 Poor urinary stream: Secondary | ICD-10-CM

## 2023-06-25 DIAGNOSIS — R351 Nocturia: Secondary | ICD-10-CM | POA: Diagnosis not present

## 2023-06-25 DIAGNOSIS — I639 Cerebral infarction, unspecified: Secondary | ICD-10-CM | POA: Diagnosis not present

## 2023-06-25 DIAGNOSIS — Z79899 Other long term (current) drug therapy: Secondary | ICD-10-CM | POA: Diagnosis not present

## 2023-06-25 MED ORDER — TADALAFIL 5 MG PO TABS
5.0000 mg | ORAL_TABLET | Freq: Every day | ORAL | 11 refills | Status: AC
Start: 2023-06-25 — End: ?

## 2023-06-25 NOTE — Progress Notes (Unsigned)
Established Patient Office Visit  Subjective   Patient ID: Bunyan Mole., male    DOB: Jul 21, 1955  Age: 67 y.o. MRN: [id number]  Chief Complaint  Patient presents with   Follow-up   Mr. Edward Jolly returns to care today for routine follow-up.  He was last evaluated by me on 6/19.  No medication changes were made at that time and 73-month follow-up was arranged.  In the interim, he has been evaluated by orthopedic surgery, ophthalmology, and dermatology.  There have otherwise been no acute interval events.  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    SMALL INTESTINE SURGERY     TONSILECTOMY, ADENOIDECTOMY, BILATERAL MYRINGOTOMY AND TUBES  1977   Social History   Tobacco Use   Smoking status: Former    Types: Cigars   Smokeless tobacco: Never  Vaping Use   Vaping status: Never Used  Substance Use Topics   Alcohol use: No   Drug use: No   Family History  Problem Relation Age of Onset   Valvular heart disease Mother    HIV Father    Stroke Father    Suicidality Brother    Cancer Paternal Uncle    Cancer Paternal Grandmother    Prostate cancer Paternal Grandfather    Cancer - Lung Neg Hx    Allergies  Allergen Reactions   Ibuprofen Nausea And Vomiting   Keflex [Cephalexin] Rash    "Niacin-like Flush"   ROS    Objective:     BP (!) 160/82   Pulse 67   Ht 5\' 11"  (1.803 m)   Wt 189 lb (85.7 kg)   SpO2 98%   BMI 26.36 kg/m  BP Readings from Last 3 Encounters:  06/25/23  (!) 160/82  01/06/23 (!) 151/88  12/24/22 122/70   Physical Exam  Last CBC Lab Results  Component Value Date   WBC 5.8 12/24/2022   HGB 14.9 12/24/2022   HCT 46.3 12/24/2022   MCV 92 12/24/2022   MCH 29.7 12/24/2022   RDW 12.2 12/24/2022   PLT 295 12/24/2022   Last metabolic panel Lab Results  Component Value Date   GLUCOSE 58 (L) 12/29/2022   NA 142 12/29/2022   K 5.3 (H) 12/29/2022   CL 102 12/29/2022   CO2 25 12/29/2022   BUN 17 12/29/2022   CREATININE 1.12 12/29/2022   EGFR 72 12/29/2022   CALCIUM 9.5 12/29/2022   PROT 6.8 12/24/2022   ALBUMIN 4.6 12/24/2022   LABGLOB 2.2 12/24/2022   AGRATIO 2.4 (H) 09/22/2022   BILITOT 0.4 12/24/2022   ALKPHOS 85 12/24/2022   AST 21 12/24/2022   ALT 20 12/24/2022   Last lipids Lab Results  Component Value Date   CHOL 189 12/24/2022   HDL 62 12/24/2022   LDLCALC 110 (H) 12/24/2022   TRIG 95 12/24/2022   CHOLHDL 3.0  12/24/2022   Last hemoglobin A1c Lab Results  Component Value Date   HGBA1C 5.4 12/24/2022   Last thyroid functions Lab Results  Component Value Date   TSH 4.320 12/24/2022   Last vitamin D Lab Results  Component Value Date   VD25OH 42.4 12/24/2022   Last vitamin B12 and Folate Lab Results  Component Value Date   VITAMINB12 1,106 12/24/2022   FOLATE >20.0 12/24/2022     Assessment & Plan:   Problem List Items Addressed This Visit   None   No follow-ups on file.    Billie Lade, MD

## 2023-06-25 NOTE — Patient Instructions (Signed)
It was a pleasure to see you today.  Thank you for giving [place] the opportunity to be involved in your care.  Below is a brief recap of your visit and next steps.  We will plan to see you again in 6 months.  Summary Start tadalafil 5 mg daily Repeat labs ordered Follow up in 6 months

## 2023-06-26 DIAGNOSIS — E781 Pure hyperglyceridemia: Secondary | ICD-10-CM | POA: Diagnosis not present

## 2023-06-26 DIAGNOSIS — Z0001 Encounter for general adult medical examination with abnormal findings: Secondary | ICD-10-CM | POA: Diagnosis not present

## 2023-06-26 DIAGNOSIS — I639 Cerebral infarction, unspecified: Secondary | ICD-10-CM | POA: Diagnosis not present

## 2023-06-26 DIAGNOSIS — N401 Enlarged prostate with lower urinary tract symptoms: Secondary | ICD-10-CM | POA: Diagnosis not present

## 2023-06-26 DIAGNOSIS — R351 Nocturia: Secondary | ICD-10-CM | POA: Diagnosis not present

## 2023-06-26 DIAGNOSIS — R3912 Poor urinary stream: Secondary | ICD-10-CM | POA: Diagnosis not present

## 2023-06-28 LAB — CMP14+EGFR
ALT: 32 [IU]/L (ref 0–44)
AST: 31 [IU]/L (ref 0–40)
Albumin: 4.5 g/dL (ref 3.9–4.9)
Alkaline Phosphatase: 85 [IU]/L (ref 44–121)
BUN/Creatinine Ratio: 19 (ref 10–24)
BUN: 22 mg/dL (ref 8–27)
Bilirubin Total: 0.5 mg/dL (ref 0.0–1.2)
CO2: 26 mmol/L (ref 20–29)
Calcium: 9.2 mg/dL (ref 8.6–10.2)
Chloride: 102 mmol/L (ref 96–106)
Creatinine, Ser: 1.16 mg/dL (ref 0.76–1.27)
Globulin, Total: 2.1 g/dL (ref 1.5–4.5)
Glucose: 83 mg/dL (ref 70–99)
Potassium: 5.1 mmol/L (ref 3.5–5.2)
Sodium: 141 mmol/L (ref 134–144)
Total Protein: 6.6 g/dL (ref 6.0–8.5)
eGFR: 69 mL/min/{1.73_m2} (ref >59)

## 2023-06-28 LAB — LIPID PANEL
Chol/HDL Ratio: 3.1 {ratio} (ref 0.0–5.0)
Cholesterol, Total: 196 mg/dL (ref 100–199)
HDL: 63 mg/dL (ref >39)
LDL Chol Calc (NIH): 120 mg/dL — ABNORMAL HIGH (ref 0–99)
Triglycerides: 74 mg/dL (ref 0–149)
VLDL Cholesterol Cal: 13 mg/dL (ref 5–40)

## 2023-06-28 LAB — HEMOGLOBIN A1C
Est. average glucose Bld gHb Est-mCnc: 108 mg/dL
Hgb A1c MFr Bld: 5.4 % (ref 4.8–5.6)

## 2023-06-28 LAB — CBC WITH DIFFERENTIAL/PLATELET
Basophils Absolute: 0.1 10*3/uL (ref 0.0–0.2)
Basos: 1 %
EOS (ABSOLUTE): 0.1 10*3/uL (ref 0.0–0.4)
Eos: 2 %
Hematocrit: 46.7 % (ref 37.5–51.0)
Hemoglobin: 14.8 g/dL (ref 13.0–17.7)
Immature Grans (Abs): 0 10*3/uL (ref 0.0–0.1)
Immature Granulocytes: 0 %
Lymphocytes Absolute: 2.6 10*3/uL (ref 0.7–3.1)
Lymphs: 38 %
MCH: 29.6 pg (ref 26.6–33.0)
MCHC: 31.7 g/dL (ref 31.5–35.7)
MCV: 93 fL (ref 79–97)
Monocytes Absolute: 0.8 10*3/uL (ref 0.1–0.9)
Monocytes: 12 %
Neutrophils Absolute: 3.2 10*3/uL (ref 1.4–7.0)
Neutrophils: 47 %
Platelets: 302 10*3/uL (ref 150–450)
RBC: 5 x10E6/uL (ref 4.14–5.80)
RDW: 12.4 % (ref 11.6–15.4)
WBC: 6.8 10*3/uL (ref 3.4–10.8)

## 2023-06-28 LAB — B12 AND FOLATE PANEL
Folate: >20 ng/mL (ref >3.0)
Vitamin B-12: 1072 pg/mL (ref 232–1245)

## 2023-06-28 LAB — TESTOSTERONE,FREE AND TOTAL
Testosterone, Free: 11.2 pg/mL (ref 6.6–18.1)
Testosterone: 574 ng/dL (ref 264–916)

## 2023-06-28 LAB — C-REACTIVE PROTEIN: CRP: 1 mg/L (ref 0–10)

## 2023-06-28 LAB — VITAMIN D 25 HYDROXY (VIT D DEFICIENCY, FRACTURES): Vit D, 25-Hydroxy: 41.7 ng/mL (ref 30.0–100.0)

## 2023-06-28 LAB — TSH+FREE T4
Free T4: 1.15 ng/dL (ref 0.82–1.77)
TSH: 5.79 u[IU]/mL — ABNORMAL HIGH (ref 0.450–4.500)

## 2023-06-28 LAB — PSA: Prostate Specific Ag, Serum: 2.2 ng/mL (ref 0.0–4.0)

## 2023-06-30 LAB — TOXASSURE SELECT 13 (MW), URINE

## 2023-07-19 NOTE — Assessment & Plan Note (Signed)
 His acute concern today is nocturia.  Previously prescribed Flomax  for treatment of BPH but never started taking it.  He would like to try taking daily tadalafil  as he has read about multiple benefits. -Through shared decision making, tadalafil  5 mg daily has been prescribed today.

## 2023-07-19 NOTE — Assessment & Plan Note (Addendum)
 Previously documented history of CVA.  Continues to take ASA 81 mg daily and again declines statin therapy.  Repeat lipid panel ordered today.

## 2023-10-13 ENCOUNTER — Ambulatory Visit (INDEPENDENT_AMBULATORY_CARE_PROVIDER_SITE_OTHER): Payer: Medicare HMO

## 2023-10-13 VITALS — BP 131/80 | HR 65 | Ht 71.0 in | Wt 191.0 lb

## 2023-10-13 DIAGNOSIS — Z0000 Encounter for general adult medical examination without abnormal findings: Secondary | ICD-10-CM | POA: Diagnosis not present

## 2023-10-13 DIAGNOSIS — Z0100 Encounter for examination of eyes and vision without abnormal findings: Secondary | ICD-10-CM

## 2023-10-13 NOTE — Patient Instructions (Signed)
 Douglas Martin , Thank you for taking time to come for your Medicare Wellness Visit. I appreciate your ongoing commitment to your health goals. Please review the following plan we discussed and let me know if I can assist you in the future.   Please see your treatment plan below: Referrals:A referral has been placed for you to have an eye exam. The information is below. If you haven't heard from them in about a week, call them to schedule your appointment.  [facility] Address: [address] [place], [place] [address] Phone: [phone] Follow-Up:Medicare AWV October 17, 2024 at 1:10 pm telephone visit Clinician Recommendations: Aim for 30 minutes of exercise or brisk walking, 6-8 glasses of water, and 5 servings of fruits and vegetables each day. You are due for the vaccines checked below. You may have these done at your preferred pharmacy. Please have them fax the office proof of the vaccines so that we can update your chart.   []  Flu (due annually)  Recommended this fall either at PCP office or through your local pharmacy. The flu season starts August 1 of each year.   [x]  Shingrix (Shingles vaccine): CDC recommends 2 doses of Shingrix separated by 2-6 months for aged 69 years and older:  [x]  Pneumonia Vaccines: Recommended for adults 65 years or older  []  TDAP (Tetanus) Vaccine every 10 years:Recommended every 10 years; Please call your insurance company to determine your out of pocket expense. You also receive this vaccine at your local pharmacy or Health Dept.  []  Covid-19: Available now at any [facility] pharmacy (see info below)  You may also get your vaccines at any [facility] (locations listed below.) Vaccine hours are Monday - Friday 9:00 - 4:00. No appointments are required. Most insurances are accepted including Medicaid. Anyone can use the community pharmacies, and people are not required to have a [facility] provider.  Community Pharmacy Locations offering  vaccines:   Sport and exercise psychologist   [facility]  10 vaccines are offered at the [employer]: Covid, flu, Tdap, shingles, RSV, pneumonia, meningococcal, hepatitis A, hepatitis B, and HPV.    This is a list of the screening recommended for you and due dates:  Health Maintenance  Topic Date Due   Zoster (Shingles) Vaccine (1 of 2) Never done   Pneumonia Vaccine (1 of 1 - PCV) Never done   Flu Shot  02/05/2024   Medicare Annual Wellness Visit  10/12/2024   Cologuard (Stool DNA test)  01/01/2026   DTaP/Tdap/Td vaccine (2 - Td or Tdap) 04/22/2027   Colon Cancer Screening  11/18/2032   Hepatitis C Screening  Completed   HPV Vaccine  Aged Out   COVID-19 Vaccine  Discontinued    Advanced directives: (Declined) Advance directive discussed with you today. Even though you declined this today, please call our office should you change your mind, and we can give you the proper paperwork for you to fill out. Advance Care Planning is important because it:  [x]  Makes sure you receive the medical care that is consistent with your values, goals, and preferences  [x]  It provides guidance to your family and loved ones and it also reduces their decisional burden about whether or not they are making the right decisions based on what you want done  Follow the link provided in your after visit summary or read over the paperwork we have mailed  to you to help you started getting your Advance Directives in place. If you need assistance in completing these, please reach out to [place] so that we can help you!   Next Medicare Annual Wellness Visit scheduled for next year: yes  Understanding Your Risk for Falls Millions of people have serious injuries from falls each year. It is important to understand your risk of falling. Talk with your health care provider about your risk and what you can do to lower it. If you  do have a serious fall, make sure to tell your provider. Falling once raises your risk of falling again. How can falls affect me? Serious injuries from falls are common. These include: Broken bones, such as hip fractures. Head injuries, such as traumatic brain injuries (TBI) or concussions. A fear of falling can cause you to avoid activities and stay at home. This can make your muscles weaker and raise your risk for a fall. What can increase my risk? There are a number of risk factors that increase your risk for falling. The more risk factors you have, the higher your risk of falling. Serious injuries from a fall happen most often to people who are older than 69 years old. Teenagers and young adults ages 35-29 are also at higher risk. Common risk factors include: Weakness in the lower body. Being generally weak or confused due to long-term (chronic) illness. Dizziness or balance problems. Poor vision. Medicines that cause dizziness or drowsiness. These may include: Medicines for your blood pressure, heart, anxiety, insomnia, or swelling (edema). Pain medicines. Muscle relaxants. Other risk factors include: Drinking alcohol. Having had a fall in the past. Having foot pain or wearing improper footwear. Working at a dangerous job. Having any of the following in your home: Tripping hazards, such as floor clutter or loose rugs. Poor lighting. Pets. Having dementia or memory loss. What actions can I take to lower my risk of falling?     Physical activity Stay physically fit. Do strength and balance exercises. Consider taking a regular class to build strength and balance. Yoga and tai chi are good options. Vision Have your eyes checked every year and your prescription for glasses or contacts updated as needed. Shoes and walking aids Wear non-skid shoes. Wear shoes that have rubber soles and low heels. Do not wear high heels. Do not walk around the house in socks or slippers. Use a  cane or walker as told by your provider. Home safety Attach secure railings on both sides of your stairs. Install grab bars for your bathtub, shower, and toilet. Use a non-skid mat in your bathtub or shower. Attach bath mats securely with double-sided, non-slip rug tape. Use good lighting in all rooms. Keep a flashlight near your bed. Make sure there is a clear path from your bed to the bathroom. Use night-lights. Do not use throw rugs. Make sure all carpeting is taped or tacked down securely. Remove all clutter from walkways and stairways, including extension cords. Repair uneven or broken steps and floors. Avoid walking on icy or slippery surfaces. Walk on the grass instead of on icy or slick sidewalks. Use ice melter to get rid of ice on walkways in the winter. Use a cordless phone. Questions to ask your health care provider Can you help me check my risk for a fall? Do any of my medicines make me more likely to fall? Should I take a vitamin D supplement? What exercises can I do to improve my strength and balance? Should I  make an appointment to have my vision checked? Do I need a bone density test to check for weak bones (osteoporosis)? Would it help to use a cane or a walker? Where to find more information Centers for Disease Control and Prevention, STEADI: TonerPromos.no Community-Based Fall Prevention Programs: TonerPromos.no [employer] on Aging: BaseRingTones.pl Contact a health care provider if: You fall at home. You are afraid of falling at home. You feel weak, drowsy, or dizzy. This information is not intended to replace advice given to you by your health care provider. Make sure you discuss any questions you have with your health care provider. Document Revised: 02/24/2022 Document Reviewed: 02/24/2022 Elsevier Patient Education  2024 Elsevier Inc.  Managing Pain Without Opioids  Opioids are strong medicines used to treat moderate to severe pain. For some people, especially those  who have long-term (chronic) pain, opioids may not be the best choice for pain management due to: Side effects like nausea, constipation, and sleepiness. The risk of addiction (opioid use disorder). The longer you take opioids, the greater your risk of addiction. Pain that lasts for more than 3 months is called chronic pain. Managing chronic pain usually requires more than one approach and is often provided by a team of health care providers working together (multidisciplinary approach). Pain management may be done at a pain management center or pain clinic. How to manage pain without the use of opioids Use non-opioid medicines Non-opioid medicines for pain may include: Over-the-counter or prescription non-steroidal anti-inflammatory drugs (NSAIDs). These may be the first medicines used for pain. They work well for muscle and bone pain, and they reduce swelling. Acetaminophen. This over-the-counter medicine may work well for milder pain but not swelling. Antidepressants. These may be used to treat chronic pain. A certain type of antidepressant (tricyclics) is often used. These medicines are given in lower doses for pain than when used for depression. Anticonvulsants. These are usually used to treat seizures but may also reduce nerve (neuropathic) pain. Muscle relaxants. These relieve pain caused by sudden muscle tightening (spasms). You may also use a pain medicine that is applied to the skin as a patch, cream, or gel (topical analgesic), such as a numbing medicine. These may cause fewer side effects than medicines taken by mouth. Do certain therapies as directed Some therapies can help with pain management. They include: Physical therapy. You will do exercises to gain strength and flexibility. A physical therapist may teach you exercises to move and stretch parts of your body that are weak, stiff, or painful. You can learn these exercises at physical therapy visits and practice them at home. Physical  therapy may also involve: Massage. Heat wraps or applying heat or cold to affected areas. Electrical signals that interrupt pain signals (transcutaneous electrical nerve stimulation, TENS). Weak lasers that reduce pain and swelling (low-level laser therapy). Signals from your body that help you learn to regulate pain (biofeedback). Occupational therapy. This helps you to learn ways to function at home and work with less pain. Recreational therapy. This involves trying new activities or hobbies, such as a physical activity or drawing. Mental health therapy, including: Cognitive behavioral therapy (CBT). This helps you learn coping skills for dealing with pain. Acceptance and commitment therapy (ACT) to change the way you think and react to pain. Relaxation therapies, including muscle relaxation exercises and mindfulness-based stress reduction. Pain management counseling. This may be individual, family, or group counseling.  Receive medical treatments Medical treatments for pain management include: Nerve block injections. These may include  a pain blocker and anti-inflammatory medicines. You may have injections: Near the spine to relieve chronic back or neck pain. Into joints to relieve back or joint pain. Into nerve areas that supply a painful area to relieve body pain. Into muscles (trigger point injections) to relieve some painful muscle conditions. A medical device placed near your spine to help block pain signals and relieve nerve pain or chronic back pain (spinal cord stimulation device). Acupuncture. Follow these instructions at home Medicines Take over-the-counter and prescription medicines only as told by your health care provider. If you are taking pain medicine, ask your health care providers about possible side effects to watch out for. Do not drive or use heavy machinery while taking prescription opioid pain medicine. Lifestyle  Do not use drugs or alcohol to reduce pain. If  you drink alcohol, limit how much you have to: 0-1 drink a day for women who are not pregnant. 0-2 drinks a day for men. Know how much alcohol is in a drink. In the [place] one drink equals one 12 oz bottle of beer (355 mL), one 5 oz glass of wine (148 mL), or one 1 oz glass of hard liquor (44 mL). Do not use any products that contain nicotine or tobacco. These products include cigarettes, chewing tobacco, and vaping devices, such as e-cigarettes. If you need help quitting, ask your health care provider. Eat a healthy diet and maintain a healthy weight. Poor diet and excess weight may make pain worse. Eat foods that are high in fiber. These include fresh fruits and vegetables, whole grains, and beans. Limit foods that are high in fat and processed sugars, such as fried and sweet foods. Exercise regularly. Exercise lowers stress and may help relieve pain. Ask your health care provider what activities and exercises are safe for you. If your health care provider approves, join an exercise class that combines movement and stress reduction. Examples include yoga and tai chi. Get enough sleep. Lack of sleep may make pain worse. Lower stress as much as possible. Practice stress reduction techniques as told by your therapist. General instructions Work with all your pain management providers to find the treatments that work best for you. You are an important member of your pain management team. There are many things you can do to reduce pain on your own. Consider joining an online or in-person support group for people who have chronic pain. Keep all follow-up visits. This is important. Where to find more information You can find more information about managing pain without opioids from: American Academy of Pain Medicine: painmed.org Institute for Chronic Pain: instituteforchronicpain.org American Chronic Pain Association: theacpa.org Contact a health care provider if: You have side effects from pain  medicine. Your pain gets worse or does not get better with treatments or home therapy. You are struggling with anxiety or depression. Summary Many types of pain can be managed without opioids. Chronic pain may respond better to pain management without opioids. Pain is best managed when you and a team of health care providers work together. Pain management without opioids may include non-opioid medicines, medical treatments, physical therapy, mental health therapy, and lifestyle changes. Tell your health care providers if your pain gets worse or is not being managed well enough. This information is not intended to replace advice given to you by your health care provider. Make sure you discuss any questions you have with your health care provider. Document Revised: 10/03/2020 Document Reviewed: 10/03/2020 Elsevier Patient Education  2023 [employer].

## 2023-10-13 NOTE — Progress Notes (Signed)
 Because this visit was a virtual/telehealth visit,  certain criteria was not obtained, such a blood pressure, CBG if applicable, and timed get up and go. Any medications not marked as "taking" were not mentioned during the medication reconciliation part of the visit. Any vitals not documented were not able to be obtained due to this being a telehealth visit or patient was unable to self-report a recent blood pressure reading due to a lack of equipment at home via telehealth. Vitals that have been documented are verbally provided by the patient.    Subjective:   Douglas Martin. is a 68 y.o. who presents for a Medicare Wellness preventive visit.  Visit Complete: Virtual I connected with  [name]. on 10/13/23 by a audio enabled telemedicine application and verified that I am speaking with the correct person using two identifiers.  Patient Location: Home  Provider Location: Home Office  I discussed the limitations of evaluation and management by telemedicine. The patient expressed understanding and agreed to proceed.  Vital Signs: Because this visit was a virtual/telehealth visit, some criteria may be missing or patient reported. Any vitals not documented were not able to be obtained and vitals that have been documented are patient reported.  VideoDeclined- This patient declined Librarian, academic. Therefore the visit was completed with audio only.  Persons Participating in Visit: Patient.  AWV Questionnaire: No: Patient Medicare AWV questionnaire was not completed prior to this visit.  Cardiac Risk Factors include: advanced age (>21men, >68 women);male gender     Objective:    Today's Vitals   10/13/23 1357  BP: 131/80  Pulse: 65  Weight: 191 lb (86.6 kg)  Height: 5\' 11"  (1.803 m)   Body mass index is 26.64 kg/m.     09/22/2022    2:41 PM 08/26/2021    1:26 PM  Advanced Directives  Does Patient Have a Medical Advance Directive? Yes Yes   Type of Special educational needs teacher of [place];Living will;Out of facility DNR (pink MOST or yellow form)  Copy of Healthcare Power of Attorney in Chart?  No - copy requested    Current Medications (verified) Outpatient Encounter Medications as of 10/13/2023  Medication Sig   Acetylcysteine (NAC) 600 MG CAPS Take 600 mg by mouth daily.   amphetamine-dextroamphetamine (ADDERALL) 10 MG tablet Take 0.5 tablets (5 mg total) by mouth daily. (Patient taking differently: Take 5 mg by mouth as needed.)   ascorbic acid (VITAMIN C) 500 MG tablet Take 500 mg by mouth daily.   aspirin EC 81 MG tablet Take 81 mg by mouth 2 (two) times a week.   Astaxanthin 4 MG CAPS Take 4 mg by mouth daily.   b complex vitamins capsule Take 1 capsule by mouth daily. 100% RDA   Cholecalciferol (VITAMIN D3) 50 MCG (2000 UT) CAPS Take by mouth. Takes daily   magnesium oxide (MAG-OX) 400 MG tablet Take 400 mg by mouth daily.   naproxen (NAPROSYN) 250 MG tablet Take 1 tablet (250 mg total) by mouth as needed.   tadalafil (CIALIS) 5 MG tablet Take 1 tablet (5 mg total) by mouth daily.   traMADol (ULTRAM) 50 MG tablet 25 mg as needed.   tretinoin (RETIN-A) 0.01 % gel Apply topically at bedtime.   Omega-3 1000 MG CAPS Take 1,400 mg by mouth 2 (two) times a week. (Patient not taking: Reported on 10/13/2023)   No facility-administered encounter medications on file as of 10/13/2023.    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 Exercise. Eats all food groups.    Takes care of parents, who lives with him.   Exercise.Loves to cycle.   Eats all food groups.    Wears seat belt.    Anoxic brain injury s/p surgery   Married.    No children.    Social Drivers of Corporate investment banker Strain: Low Risk  (10/13/2023)   Overall Financial Resource Strain (CARDIA)    Difficulty of Paying Living Expenses: Not hard at all  Food Insecurity: No Food Insecurity (10/13/2023)   Hunger Vital Sign    Worried About Running Out of Food in the Last Year: Never true    Ran Out of Food in the Last Year: Never true  Transportation Needs: No Transportation Needs (10/13/2023)   PRAPARE - Administrator, Civil Service (Medical): No    Lack of Transportation (Non-Medical): No  Physical  Activity: Sufficiently Active (10/13/2023)   Exercise Vital Sign    Days of Exercise per Week: 7 days    Minutes of Exercise per Session: 30 min  Stress: No Stress Concern Present (10/13/2023)   [employer] of Occupational Health - Occupational Stress Questionnaire    Feeling of Stress : Not at all  Social Connections: Moderately Isolated (10/13/2023)   Social Connection and Isolation Panel [NHANES]    Frequency of Communication with Friends and Family: More than three times a week    Frequency of Social Gatherings with Friends  and Family: More than three times a week    Attends Religious Services: 1 to 4 times per year    Active Member of Clubs or Organizations: No    Attends Banker Meetings: Never    Marital Status: Widowed    Tobacco Counseling Counseling given: Yes    Clinical Intake:  Pre-visit preparation completed: Yes  Pain : No/denies pain     BMI - recorded: 26.64 Nutritional Status: BMI 25 -29 Overweight Nutritional Risks: None Diabetes: No  Lab Results  Component Value Date   HGBA1C 5.4 06/26/2023   HGBA1C 5.4 12/24/2022   HGBA1C 5.5 09/22/2022     How often do you need to have someone help you when you read instructions, pamphlets, or other written materials from your doctor or pharmacy?: 1 - Never  Interpreter Needed?: No  Information entered by :: Maryjean Ka CMA   Activities of Daily Living     10/13/2023    1:59 PM  In your present state of health, do you have any difficulty performing the following activities:  Hearing? 1  Comment yes. patient has difficulty hearing. has had hearing aids. but didn't like them.  Vision? 0  Difficulty concentrating or making decisions? 0  Walking or climbing stairs? 0  Dressing or bathing? 0  Doing errands, shopping? 0  Preparing Food and eating ? N  Using the Toilet? N  In the past six months, have you accidently leaked urine? N  Do you have problems with loss of bowel control? N  Managing your  Medications? N  Managing your Finances? N  Housekeeping or managing your Housekeeping? N    Patient Care Team: Billie Lade, MD as PCP - General (Internal Medicine)  Indicate any recent Medical Services you may have received from other than [facility] providers in the past year (date may be approximate).     Assessment:   This is a routine wellness examination for [place].  Hearing/Vision screen Hearing Screening - Comments:: Has hearing difficulties.  Declined referral to audiology  Vision Screening - Comments:: Wears rx glasses - up to date with routine eye exams  Patient's previous provider left the practice. Referral placed today.    Goals Addressed             This Visit's Progress    Patient Stated         Depression Screen     10/13/2023    2:22 PM 12/24/2022    1:25 PM 09/22/2022    2:37 PM 09/22/2022    2:25 PM 09/22/2022    2:24 PM 05/09/2022    2:59 PM 01/02/2022    2:33 PM  PHQ 2/9 Scores  PHQ - 2 Score 0 0 1 2 1  0 0  PHQ- 9 Score  1 2        Fall Risk     10/13/2023    2:12 PM 12/24/2022    1:25 PM 09/22/2022    2:37 PM 09/22/2022    2:29 PM 05/09/2022    2:58 PM  Fall Risk   Falls in the past year? 0 0 0 0 0  Number falls in past yr: 0 0 0 0 0  Injury with Fall? 0 0 0 0 0  Risk for fall due to : No Fall Risks No Fall Risks No Fall Risks  No Fall Risks  Follow up Falls prevention discussed;Falls evaluation completed Falls evaluation completed Falls evaluation completed  Falls evaluation completed    MEDICARE  RISK AT HOME:  Medicare Risk at Home Any stairs in or around the home?: Yes If so, are there any without handrails?: No Home free of loose throw rugs in walkways, pet beds, electrical cords, etc?: Yes Adequate lighting in your home to reduce risk of falls?: Yes Life alert?: No Use of a cane, walker or w/c?: No Grab bars in the bathroom?: Yes Shower chair or bench in shower?: Yes Elevated toilet seat or a handicapped toilet?: No  TIMED UP AND  GO:  Was the test performed?  No  Cognitive Function: 6CIT completed        10/13/2023    2:16 PM 09/22/2022    2:44 PM 08/26/2021    1:31 PM  6CIT Screen  What Year? 0 points 0 points 0 points  What month? 0 points 0 points 0 points  What time? 0 points 0 points 0 points  Count back from 20 0 points 0 points 0 points  Months in reverse 0 points 0 points 0 points  Repeat phrase 0 points 0 points 0 points  Total Score 0 points 0 points 0 points    Immunizations Immunization History  Administered Date(s) Administered   Fluad Quad(high Dose 65+) 06/29/2019, 04/10/2021, 05/14/2022   Influenza,inj,Quad PF,6+ Mos 03/19/2017, 04/17/2020   Tdap 04/21/2017    Screening Tests Health Maintenance  Topic Date Due   Zoster Vaccines- Shingrix (1 of 2) Never done   Pneumonia Vaccine 90+ Years old (1 of 1 - PCV) Never done   INFLUENZA VACCINE  02/05/2024   Medicare Annual Wellness (AWV)  10/12/2024   Fecal DNA (Cologuard)  01/01/2026   DTaP/Tdap/Td (2 - Td or Tdap) 04/22/2027   Colonoscopy  11/18/2032   Hepatitis C Screening  Completed   HPV VACCINES  Aged Out   COVID-19 Vaccine  Discontinued    Health Maintenance  Health Maintenance Due  Topic Date Due   Zoster Vaccines- Shingrix (1 of 2) Never done   Pneumonia Vaccine 51+ Years old (1 of 1 - PCV) Never done   Health Maintenance Items Addressed: Discussed recommended vaccines today.  Referral to ophthalmology placed.   Additional Screening:  Vision Screening: Recommended annual ophthalmology exams for early detection of glaucoma and other disorders of the eye.  Dental Screening: Recommended annual dental exams for proper oral hygiene  Community Resource Referral / Chronic Care Management: CRR required this visit?  No   CCM required this visit?  No     Plan:     I have personally reviewed and noted the following in the patient's chart:   Medical and social history Use of alcohol, tobacco or illicit drugs   Current medications and supplements including opioid prescriptions. Patient is currently taking opioid prescriptions. Information provided to patient regarding non-opioid alternatives. Patient advised to discuss non-opioid treatment plan with their provider. Functional ability and status Nutritional status Physical activity Advanced directives List of other physicians Hospitalizations, surgeries, and ER visits in previous 12 months Vitals Screenings to include cognitive, depression, and falls Referrals and appointments  In addition, I have reviewed and discussed with patient certain preventive protocols, quality metrics, and best practice recommendations. A written personalized care plan for preventive services as well as general preventive health recommendations were provided to patient.     Jordan Hawks Marvalene Barrett, CMA   10/13/2023   After Visit Summary: (Mail) Due to this being a telephonic visit, the after visit summary with patients personalized plan was offered to patient via mail   Notes: Please  refer to Routing Comments.

## 2023-12-22 ENCOUNTER — Encounter: Payer: Self-pay | Admitting: Internal Medicine

## 2023-12-22 ENCOUNTER — Ambulatory Visit

## 2023-12-22 ENCOUNTER — Ambulatory Visit (INDEPENDENT_AMBULATORY_CARE_PROVIDER_SITE_OTHER): Admitting: Internal Medicine

## 2023-12-22 ENCOUNTER — Ambulatory Visit: Admitting: Internal Medicine

## 2023-12-22 VITALS — BP 145/79 | HR 52 | Ht 71.0 in | Wt 195.8 lb

## 2023-12-22 DIAGNOSIS — G4710 Hypersomnia, unspecified: Secondary | ICD-10-CM | POA: Diagnosis not present

## 2023-12-22 DIAGNOSIS — N401 Enlarged prostate with lower urinary tract symptoms: Secondary | ICD-10-CM

## 2023-12-22 DIAGNOSIS — E781 Pure hyperglyceridemia: Secondary | ICD-10-CM | POA: Diagnosis not present

## 2023-12-22 DIAGNOSIS — I639 Cerebral infarction, unspecified: Secondary | ICD-10-CM | POA: Diagnosis not present

## 2023-12-22 DIAGNOSIS — M7918 Myalgia, other site: Secondary | ICD-10-CM | POA: Diagnosis not present

## 2023-12-22 DIAGNOSIS — R3912 Poor urinary stream: Secondary | ICD-10-CM | POA: Diagnosis not present

## 2023-12-22 DIAGNOSIS — G8929 Other chronic pain: Secondary | ICD-10-CM

## 2023-12-22 MED ORDER — TRAMADOL HCL 50 MG PO TABS: 25.0000 mg | ORAL_TABLET | Freq: Every day | ORAL | 0 refills | Status: AC | PRN

## 2023-12-22 MED ORDER — AMPHETAMINE-DEXTROAMPHETAMINE 10 MG PO TABS
5.0000 mg | ORAL_TABLET | Freq: Every day | ORAL | Status: AC
Start: 2023-12-22 — End: ?

## 2023-12-22 MED ORDER — NAPROXEN 250 MG PO TABS: 250.0000 mg | ORAL_TABLET | ORAL | 3 refills | Status: AC | PRN

## 2023-12-22 NOTE — Progress Notes (Signed)
 Established Patient Office Visit  Subjective   Patient ID: Douglas Kirk., male    DOB: 10-Aug-1955  Age: 68 y.o. MRN: [id number]  Chief Complaint  Patient presents with   Care Management    Six month follow up   Douglas Martin returns to care today for routine follow-up.  He was last evaluated by me in December 2024.  At that time he endorsed poorly controlled nocturia.  He had previously been prescribed Flomax  but never started taking it.  He requested a prescription for tadalafil  as he had read about multiple benefits.  62-month follow-up was arranged for reassessment.  There have been no acute interval events.  Today he reports feeling well.  He states that he developed tinnitus after starting tadalafil  and has discontinued it.  He would like to establish care with urology.  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    SMALL INTESTINE SURGERY     TONSILECTOMY, ADENOIDECTOMY, BILATERAL MYRINGOTOMY AND TUBES  1977   Social History   Tobacco Use   Smoking status: Former    Types: Cigars   Smokeless tobacco: Never  Vaping Use   Vaping status: Never Used  Substance Use Topics   Alcohol use: No   Drug use: No   Family History  Problem Relation Age of Onset   Valvular heart disease Mother    HIV Father    Stroke Father    Suicidality Brother    Cancer Paternal Uncle    Cancer Paternal Grandmother    Prostate cancer Paternal Grandfather    Cancer - Lung Neg Hx    Allergies  Allergen  Reactions   Ibuprofen Nausea And Vomiting   Keflex [Cephalexin] Rash    Niacin-like Flush   Review of Systems  Constitutional:  Negative for chills and fever.  HENT:  Negative for sore throat.   Respiratory:  Negative for cough and shortness of breath.   Cardiovascular:  Negative for chest pain, palpitations and leg swelling.  Gastrointestinal:  Negative for abdominal pain, blood in stool, constipation, diarrhea, nausea and vomiting.  Genitourinary:  Positive for frequency (Nocturia). Negative for dysuria and hematuria.  Musculoskeletal:  Negative for myalgias.  Skin:  Negative for itching and rash.  Neurological:  Negative for dizziness and headaches.  Psychiatric/Behavioral:  Negative for depression and suicidal ideas.      Objective:     BP (!) 145/79   Pulse (!) 52   Ht 5' 11 (1.803 m)   Wt 195 lb 12.8 oz (88.8 kg)   SpO2 98%   BMI 27.31 kg/m  BP Readings from Last 3 Encounters:  12/22/23 (!) 145/79  10/13/23 131/80  06/25/23 (!) 144/80   Physical Exam Vitals reviewed.  Constitutional:      General: He is not in acute distress.    Appearance: Normal appearance. He is  not ill-appearing.  HENT:     Head: Normocephalic and atraumatic.     Right Ear: External ear normal.     Left Ear: External ear normal.     Nose: Nose normal. No congestion or rhinorrhea.     Mouth/Throat:     Mouth: Mucous membranes are moist.     Pharynx: Oropharynx is clear.   Eyes:     General: No scleral icterus.    Extraocular Movements: Extraocular movements intact.     Conjunctiva/sclera: Conjunctivae normal.     Pupils: Pupils are equal, round, and reactive to light.    Cardiovascular:     Rate and Rhythm: Normal rate and regular rhythm.     Pulses: Normal pulses.     Heart sounds: Normal heart sounds. No murmur heard. Pulmonary:     Effort: Pulmonary effort is normal.     Breath sounds: Normal breath sounds. No wheezing, rhonchi or rales.  Abdominal:     General: Abdomen is  flat. Bowel sounds are normal. There is no distension.     Palpations: Abdomen is soft.     Tenderness: There is no abdominal tenderness.   Musculoskeletal:        General: No swelling or deformity. Normal range of motion.     Cervical back: Normal range of motion.   Skin:    General: Skin is warm and dry.     Capillary Refill: Capillary refill takes less than 2 seconds.   Neurological:     General: No focal deficit present.     Mental Status: He is alert and oriented to person, place, and time.     Motor: No weakness.   Psychiatric:        Mood and Affect: Mood normal.        Behavior: Behavior normal.        Thought Content: Thought content normal.   Last CBC Lab Results  Component Value Date   WBC 6.8 06/26/2023   HGB 14.8 06/26/2023   HCT 46.7 06/26/2023   MCV 93 06/26/2023   MCH 29.6 06/26/2023   RDW 12.4 06/26/2023   PLT 302 06/26/2023   Last metabolic panel Lab Results  Component Value Date   GLUCOSE 83 06/26/2023   NA 141 06/26/2023   K 5.1 06/26/2023   CL 102 06/26/2023   CO2 26 06/26/2023   BUN 22 06/26/2023   CREATININE 1.16 06/26/2023   EGFR 69 06/26/2023   CALCIUM  9.2 06/26/2023   PROT 6.6 06/26/2023   ALBUMIN 4.5 06/26/2023   LABGLOB 2.1 06/26/2023   AGRATIO 2.4 (H) 09/22/2022   BILITOT 0.5 06/26/2023   ALKPHOS 85 06/26/2023   AST 31 06/26/2023   ALT 32 06/26/2023   Last lipids Lab Results  Component Value Date   CHOL 196 06/26/2023   HDL 63 06/26/2023   LDLCALC 120 (H) 06/26/2023   TRIG 74 06/26/2023   CHOLHDL 3.1 06/26/2023   Last hemoglobin A1c Lab Results  Component Value Date   HGBA1C 5.4 06/26/2023   Last thyroid  functions Lab Results  Component Value Date   TSH 5.790 (H) 06/26/2023   Last vitamin D  Lab Results  Component Value Date   VD25OH 41.7 06/26/2023   Last vitamin B12 and Folate Lab Results  Component Value Date   VITAMINB12 1,072 06/26/2023   FOLATE >20.0 06/26/2023     Assessment & Plan:   Problem  List Items Addressed This Visit       Stroke (HCC)  History of CVA.  He continues to take ASA 81 mg daily and declines statin therapy.      Benign prostatic hyperplasia with weak urinary stream - Primary   Tadalafil  was prescribed at his last appointment but he developed tinnitus.  He has previously been prescribed Flomax  in the setting of BPH with nocturia but never started taking it.  He still has a prescription at home.  Urology referral placed today at his request.  I have also ordered repeat PSA and free/total testosterone  levels at his request.      Chronic musculoskeletal pain   Documented history of chronic musculoskeletal pain.  He has previously been prescribed tramadol  50 mg daily as needed and naproxen  for as needed use.  He reports using tramadol  sparingly.  PDMP reviewed.  Prescription last filled in November 2023.  Requesting refill today. -Tramadol  25 mg daily as needed has been prescribed.  Naproxen  refilled as well.      Return in about 6 months (around 06/22/2024).   Tobi Fortes, MD

## 2023-12-22 NOTE — Patient Instructions (Signed)
 It was a pleasure to see you today.  Thank you for giving us  the opportunity to be involved in your care.  Below is a brief recap of your visit and next steps.  We will plan to see you again in 6 months.  Summary Urology referral placed Refills provided Repeat labs Follow up in 6 months

## 2023-12-22 NOTE — Assessment & Plan Note (Signed)
 History of CVA.  He continues to take ASA 81 mg daily and declines statin therapy.

## 2023-12-22 NOTE — Assessment & Plan Note (Signed)
 Tadalafil  was prescribed at his last appointment but he developed tinnitus.  He has previously been prescribed Flomax  in the setting of BPH with nocturia but never started taking it.  He still has a prescription at home.  Urology referral placed today at his request.  I have also ordered repeat PSA and free/total testosterone  levels at his request.

## 2023-12-22 NOTE — Assessment & Plan Note (Signed)
 Documented history of chronic musculoskeletal pain.  He has previously been prescribed tramadol  50 mg daily as needed and naproxen  for as needed use.  He reports using tramadol  sparingly.  PDMP reviewed.  Prescription last filled in November 2023.  Requesting refill today. -Tramadol  25 mg daily as needed has been prescribed.  Naproxen  refilled as well.

## 2023-12-23 ENCOUNTER — Ambulatory Visit: Payer: Self-pay | Admitting: Internal Medicine

## 2023-12-24 ENCOUNTER — Ambulatory Visit: Payer: Medicare HMO | Admitting: Internal Medicine

## 2023-12-24 LAB — CBC WITH DIFFERENTIAL/PLATELET
Basophils Absolute: 0.1 10*3/uL (ref 0.0–0.2)
Basos: 1 %
EOS (ABSOLUTE): 0.1 10*3/uL (ref 0.0–0.4)
Eos: 2 %
Hematocrit: 47.8 % (ref 37.5–51.0)
Hemoglobin: 15.3 g/dL (ref 13.0–17.7)
Immature Grans (Abs): 0 10*3/uL (ref 0.0–0.1)
Immature Granulocytes: 0 %
Lymphocytes Absolute: 1.6 10*3/uL (ref 0.7–3.1)
Lymphs: 24 %
MCH: 30.4 pg (ref 26.6–33.0)
MCHC: 32 g/dL (ref 31.5–35.7)
MCV: 95 fL (ref 79–97)
Monocytes Absolute: 0.8 10*3/uL (ref 0.1–0.9)
Monocytes: 11 %
Neutrophils Absolute: 4.2 10*3/uL (ref 1.4–7.0)
Neutrophils: 62 %
Platelets: 299 10*3/uL (ref 150–450)
RBC: 5.04 x10E6/uL (ref 4.14–5.80)
RDW: 12.3 % (ref 11.6–15.4)
WBC: 6.8 10*3/uL (ref 3.4–10.8)

## 2023-12-24 LAB — CMP14+EGFR
ALT: 24 IU/L (ref 0–44)
AST: 23 IU/L (ref 0–40)
Albumin: 4.6 g/dL (ref 3.9–4.9)
Alkaline Phosphatase: 86 IU/L (ref 44–121)
BUN/Creatinine Ratio: 15 (ref 10–24)
BUN: 19 mg/dL (ref 8–27)
Bilirubin Total: 0.4 mg/dL (ref 0.0–1.2)
CO2: 24 mmol/L (ref 20–29)
Calcium: 9.5 mg/dL (ref 8.6–10.2)
Chloride: 101 mmol/L (ref 96–106)
Creatinine, Ser: 1.27 mg/dL (ref 0.76–1.27)
Globulin, Total: 2.3 g/dL (ref 1.5–4.5)
Glucose: 77 mg/dL (ref 70–99)
Potassium: 5.3 mmol/L — ABNORMAL HIGH (ref 3.5–5.2)
Sodium: 140 mmol/L (ref 134–144)
Total Protein: 6.9 g/dL (ref 6.0–8.5)
eGFR: 62 mL/min/{1.73_m2} (ref >59)

## 2023-12-24 LAB — HEMOGLOBIN A1C
Est. average glucose Bld gHb Est-mCnc: 108 mg/dL
Hgb A1c MFr Bld: 5.4 % (ref 4.8–5.6)

## 2023-12-24 LAB — VITAMIN D 25 HYDROXY (VIT D DEFICIENCY, FRACTURES): Vit D, 25-Hydroxy: 36.7 ng/mL (ref 30.0–100.0)

## 2023-12-24 LAB — LIPID PANEL
Chol/HDL Ratio: 3.6 ratio (ref 0.0–5.0)
Cholesterol, Total: 203 mg/dL — ABNORMAL HIGH (ref 100–199)
HDL: 56 mg/dL (ref >39)
LDL Chol Calc (NIH): 126 mg/dL — ABNORMAL HIGH (ref 0–99)
Triglycerides: 118 mg/dL (ref 0–149)
VLDL Cholesterol Cal: 21 mg/dL (ref 5–40)

## 2023-12-24 LAB — B12 AND FOLATE PANEL
Folate: 18 ng/mL (ref >3.0)
Vitamin B-12: 1177 pg/mL (ref 232–1245)

## 2023-12-24 LAB — C-REACTIVE PROTEIN: CRP: 1 mg/L (ref 0–10)

## 2023-12-24 LAB — TESTOSTERONE,FREE AND TOTAL
Testosterone, Free: 7.4 pg/mL (ref 6.6–18.1)
Testosterone: 503 ng/dL (ref 264–916)

## 2023-12-24 LAB — TSH+FREE T4
Free T4: 1.14 ng/dL (ref 0.82–1.77)
TSH: 3.75 u[IU]/mL (ref 0.450–4.500)

## 2023-12-24 LAB — PSA: Prostate Specific Ag, Serum: 2.5 ng/mL (ref 0.0–4.0)

## 2024-02-03 ENCOUNTER — Emergency Department (HOSPITAL_COMMUNITY)

## 2024-02-03 ENCOUNTER — Other Ambulatory Visit: Payer: Self-pay

## 2024-02-03 ENCOUNTER — Emergency Department (HOSPITAL_COMMUNITY)
Admission: EM | Admit: 2024-02-03 | Discharge: 2024-02-03 | Disposition: A | Discharge status: Home / Self Care | Attending: Emergency Medicine | Admitting: Emergency Medicine

## 2024-02-03 ENCOUNTER — Ambulatory Visit: Payer: Self-pay

## 2024-02-03 ENCOUNTER — Encounter (HOSPITAL_COMMUNITY): Payer: Self-pay

## 2024-02-03 DIAGNOSIS — R0602 Shortness of breath: Secondary | ICD-10-CM | POA: Diagnosis not present

## 2024-02-03 DIAGNOSIS — Z7982 Long term (current) use of aspirin: Secondary | ICD-10-CM | POA: Insufficient documentation

## 2024-02-03 DIAGNOSIS — R0600 Dyspnea, unspecified: Secondary | ICD-10-CM

## 2024-02-03 DIAGNOSIS — R079 Chest pain, unspecified: Secondary | ICD-10-CM | POA: Diagnosis not present

## 2024-02-03 DIAGNOSIS — J9811 Atelectasis: Secondary | ICD-10-CM | POA: Diagnosis not present

## 2024-02-03 LAB — BRAIN NATRIURETIC PEPTIDE: B Natriuretic Peptide: 100 pg/mL (ref 0.0–100.0)

## 2024-02-03 LAB — CBC WITH DIFFERENTIAL/PLATELET
Abs Immature Granulocytes: 0.01 K/uL (ref 0.00–0.07)
Basophils Absolute: 0.1 K/uL (ref 0.0–0.1)
Basophils Relative: 1 %
Eosinophils Absolute: 0.1 K/uL (ref 0.0–0.5)
Eosinophils Relative: 1 %
HCT: 45.2 % (ref 39.0–52.0)
Hemoglobin: 15 g/dL (ref 13.0–17.0)
Immature Granulocytes: 0 %
Lymphocytes Relative: 21 %
Lymphs Abs: 1.3 K/uL (ref 0.7–4.0)
MCH: 30.9 pg (ref 26.0–34.0)
MCHC: 33.2 g/dL (ref 30.0–36.0)
MCV: 93.2 fL (ref 80.0–100.0)
Monocytes Absolute: 0.6 K/uL (ref 0.1–1.0)
Monocytes Relative: 9 %
Neutro Abs: 4.2 K/uL (ref 1.7–7.7)
Neutrophils Relative %: 68 %
Platelets: 272 K/uL (ref 150–400)
RBC: 4.85 MIL/uL (ref 4.22–5.81)
RDW: 12.8 % (ref 11.5–15.5)
WBC: 6.1 K/uL (ref 4.0–10.5)
nRBC: 0 % (ref 0.0–0.2)

## 2024-02-03 LAB — COMPREHENSIVE METABOLIC PANEL WITH GFR
ALT: 23 U/L (ref 0–44)
AST: 23 U/L (ref 15–41)
Albumin: 4 g/dL (ref 3.5–5.0)
Alkaline Phosphatase: 63 U/L (ref 38–126)
Anion gap: 11 (ref 5–15)
BUN: 18 mg/dL (ref 8–23)
CO2: 23 mmol/L (ref 22–32)
Calcium: 8.5 mg/dL — ABNORMAL LOW (ref 8.9–10.3)
Chloride: 105 mmol/L (ref 98–111)
Creatinine, Ser: 1.28 mg/dL — ABNORMAL HIGH (ref 0.61–1.24)
GFR, Estimated: >60 mL/min (ref >60)
Glucose, Bld: 104 mg/dL — ABNORMAL HIGH (ref 70–99)
Potassium: 4.6 mmol/L (ref 3.5–5.1)
Sodium: 139 mmol/L (ref 135–145)
Total Bilirubin: 1 mg/dL (ref 0.0–1.2)
Total Protein: 6.6 g/dL (ref 6.5–8.1)

## 2024-02-03 LAB — TROPONIN I (HIGH SENSITIVITY): Troponin I (High Sensitivity): 3 ng/L (ref <18)

## 2024-02-03 MED ORDER — IOHEXOL 350 MG/ML SOLN
75.0000 mL | Freq: Once | INTRAVENOUS | Status: AC | PRN
  Administered 2024-02-03: 75 mL via INTRAVENOUS

## 2024-02-03 NOTE — Discharge Instructions (Signed)
 Pleasure taking care of you today.  You are seen for shortness of breath ongoing for the past couple of weeks.  Fortunately your workup was overall very reassuring.  EKG was normal.  We did a CAT scan of your chest that was negative as well.  It is very important for you to follow-up closely with cardiology.  I have placed a referral.  Also follow-up with your PCP.  Come back to the ER for new or worsening symptoms.

## 2024-02-03 NOTE — Telephone Encounter (Signed)
 Patient advised to go to ED .

## 2024-02-03 NOTE — Telephone Encounter (Signed)
 FYI Only or Action Required?: Action required by provider: update on patient condition.  Patient was last seen in primary care on 12/22/2023 by Melvenia Manus BRAVO, MD.  Called Nurse Triage reporting Shortness of Breath.  Symptoms began several weeks ago.  Interventions attempted: Nothing.  Symptoms are: gradually worsening.  Triage Disposition: Go to ED Now (Notify PCP)  Patient/caregiver understands and will follow disposition?: YesCopied from CRM [id number]. Topic: Clinical - Red Word Triage >> Feb 03, 2024 12:40 PM Wess S wrote: fatigue, shortness of breath, Nausea Reason for Disposition  [1] MODERATE difficulty breathing (e.g., speaks in phrases, SOB even at rest, pulse 100-120) AND [2] NEW-onset or WORSE than normal  Answer Assessment - Initial Assessment Questions 1. RESPIRATORY STATUS: Describe your breathing? (e.g., wheezing, shortness of breath, unable to speak, severe coughing)      Suffocating feeling 2. ONSET: When did this breathing problem begin?      Several weeks 3. PATTERN Does the difficult breathing come and go, or has it been constant since it started?      constant 4. SEVERITY: How bad is your breathing? (e.g., mild, moderate, severe)      Moderate to severe 5. RECURRENT SYMPTOM: Have you had difficulty breathing before? If Yes, ask: When was the last time? and What happened that time?      denies 6. CARDIAC HISTORY: Do you have any history of heart disease? (e.g., heart attack, angina, bypass surgery, angioplasty)      denies 7. LUNG HISTORY: Do you have any history of lung disease?  (e.g., pulmonary embolus, asthma, emphysema)     denies 8. CAUSE: What do you think is causing the breathing problem?      Not sure 9. OTHER SYMPTOMS: Do you have any other symptoms? (e.g., chest pain, cough, dizziness, fever, runny nose)     Nausea and fatigue     SOB going on several weeks. Pt did mention getting tick bites about a month ago. Ticks  removed and no issues following. Pt feels like he has to make himseflp hyperventilate to move air.Pt sitting in chair  currently with nausea and mild SOB. Denies chest pain and lightheadedness and vision changes. Pt sounds worrisome but not gasping for air during call.  RN advised pt to go to ED. Pt refused 911 but is going to drive himself although RN advised someone take him.  Protocols used: Breathing Difficulty-A-AH

## 2024-02-03 NOTE — ED Provider Notes (Signed)
  EMERGENCY DEPARTMENT AT [facility] Provider Note   CSN: [id number] Arrival date & time: 02/03/24  1404     Patient presents with: Shortness of Breath   Douglas Martin. is a 68 y.o. male.  History of stroke, high cholesterol.  Presents the ER today for evaluation of shortness of breath.  Has been out for the past several weeks.  He states that even at rest inside in the air conditioning he feels like he is suffocating.  He does not experience any chest pain with this.  When he exerts himself he does not have worsening of symptoms but does note that he has decreased his exercise due to the sensation of shortness of breath.  He reports he is an avid cyclist and also does weight training and other exercise.  He reports he had a coronary artery calcium  score a couple of years ago and that the score was 4 and he was told no further treatment was needed.  He is on baby aspirin daily      Shortness of Breath      Prior to Admission medications   Medication Sig Start Date End Date Taking? Authorizing Provider  Acetylcysteine (NAC) 600 MG CAPS Take 600 mg by mouth daily.    [provider]  amphetamine -dextroamphetamine  (ADDERALL) 10 MG tablet Take 0.5 tablets (5 mg total) by mouth daily. 12/22/23   Dixon, Phillip E, MD  ascorbic acid (VITAMIN C) 500 MG tablet Take 500 mg by mouth in the morning.    [provider]  aspirin EC 81 MG tablet Take 81 mg by mouth 2 (two) times a week.    [provider]  Astaxanthin 4 MG CAPS Take 4 mg by mouth daily as needed (general health).    [provider]  b complex vitamins capsule Take 1 capsule by mouth daily. 100% RDA    [provider]  Cholecalciferol (VITAMIN D3) 50 MCG (2000 UT) CAPS Take by mouth. Takes daily    [provider]  co-enzyme Q-10 30 MG capsule Take 100 mg by mouth daily.    [provider]  Multiple Vitamins-Minerals (CENTRUM SILVER 50+MEN PO)  Take 0.5 tablets by mouth daily.    [provider]  naproxen  (NAPROSYN ) 250 MG tablet Take 1 tablet (250 mg total) by mouth as needed. 12/22/23   Melvenia Manus BRAVO, MD  Omega-3 1000 MG CAPS Take 1,400 mg by mouth 2 (two) times a week. Patient not taking: Reported on 12/22/2023    [provider]  traMADol  (ULTRAM ) 50 MG tablet Take 0.5 tablets (25 mg total) by mouth daily as needed. 12/22/23   Melvenia Manus BRAVO, MD  tretinoin  (RETIN-A ) 0.01 % gel Apply topically at bedtime. 05/09/22   Melvenia Manus BRAVO, MD    Allergies: Ibuprofen and Keflex [cephalexin]    Review of Systems  Respiratory:  Positive for shortness of breath.     Updated Vital Signs BP (!) 154/90 (BP Location: Right Arm)   Pulse 65   Temp 98.3 F (36.8 C) (Oral)   Resp 17   Ht 5' 11 (1.803 m)   Wt 88.8 kg   SpO2 99%   BMI 27.30 kg/m   Physical Exam Vitals and nursing note reviewed.  Constitutional:      General: He is not in acute distress.    Appearance: He is well-developed.  HENT:     Head: Normocephalic and atraumatic.  Eyes:     Extraocular Movements:  Extraocular movements intact.     Conjunctiva/sclera: Conjunctivae normal.     Pupils: Pupils are equal, round, and reactive to light.  Cardiovascular:     Rate and Rhythm: Normal rate and regular rhythm.     Heart sounds: No murmur heard. Pulmonary:     Effort: Pulmonary effort is normal. No respiratory distress.     Breath sounds: Normal breath sounds.  Abdominal:     Palpations: Abdomen is soft.     Tenderness: There is no abdominal tenderness.  Musculoskeletal:        General: No swelling. Normal range of motion.     Cervical back: Neck supple.     Right lower leg: No tenderness. No edema.     Left lower leg: No tenderness. No edema.  Skin:    General: Skin is warm and dry.     Capillary Refill: Capillary refill takes less than 2 seconds.  Neurological:     General: No focal deficit present.     Mental Status: He is alert and  oriented to person, place, and time.  Psychiatric:        Mood and Affect: Mood normal.     (all labs ordered are listed, but only abnormal results are displayed) Labs Reviewed  COMPREHENSIVE METABOLIC PANEL WITH GFR - Abnormal; Notable for the following components:      Result Value   Glucose, Bld 104 (*)    Creatinine, Ser 1.28 (*)    Calcium  8.5 (*)    All other components within normal limits  CBC WITH DIFFERENTIAL/PLATELET  BRAIN NATRIURETIC PEPTIDE  TROPONIN I (HIGH SENSITIVITY)    EKG: None  Radiology: DG Chest 2 View Result Date: 02/03/2024 CLINICAL DATA:  Shortness of breath EXAM: CHEST - 2 VIEW COMPARISON:  None Available. FINDINGS: Linear opacity left lung base likely scar or atelectasis. No consolidation, pneumothorax or effusion. No edema. Normal cardiopericardial silhouette. Degenerative changes along the spine. IMPRESSION: No acute cardiopulmonary disease. Electronically Signed   By: Ranell Bring M.D.   On: 02/03/2024 14:40     Procedures   Medications Ordered in the ED - No data to display                                  Medical Decision Making This patient presents to the ED for concern of onset shortness of breath for the past couple of weeks, this involves an extensive number of treatment options, and is a complaint that carries with it a high risk of complications and morbidity.  The differential diagnosis includes ACS, pneumonia, PE, CHF, pneumothorax, other   Co morbidities that complicate the patient evaluation :   History of stroke   Additional history obtained:  Additional history obtained from EMR External records from outside source obtained and reviewed including ER notes, labs, imaging-specifically cardiac CT from 2023   Lab Tests:  I Ordered, and personally interpreted labs.  The pertinent results include: Troponin is 3, BNP is normal, CMP with renal function at baseline, CBC with no anemia or leukocytosis.   Imaging Studies  ordered:  I ordered imaging studies including x-ray which shows a edema or infiltrate; CTA chest shows no PE, no consolidation or other acute abnormalities I independently visualized and interpreted imaging within scope of identifying emergent findings  I agree with the radiologist interpretation   Cardiac Monitoring: / EKG:  The patient was maintained on a cardiac monitor.  I  personally viewed and interpreted the cardiac monitored which showed an underlying rhythm of: Sinus bradycardia   Consultations Obtained:  Discussed with Dr. Nils while patient was in the ED   Problem List / ED Course / Critical interventions / Medication management  Dyspnea-this is constant and does not worsen with exertion for the past several weeks and has been unchanged.  Patient is very active and symptoms do not worsen with exertion but he states that this made him do less activity because he is not feeling well.  He is having a chest pain, no EKG changes, he does have heart rate in the 40s to 50s but has normal blood pressure, is not dizzy and states this is not abnormal for him and he is extremely active, do not feel this is the cause of his shortness of breath acutely.  Troponin is negative.  PE study ordered as well and is negative.  Discussed with patient his workup is very reassuring but would recommend follow-up with cardiology and he was given strict return precautions.  I have reviewed the patients home medicines and have made adjustments as needed   Social Determinants of Health:  Non-smoker, very active, lives independently      Amount and/or Complexity of Data Reviewed Labs: ordered. Radiology: ordered.  Risk Prescription drug management.        Final diagnoses:  None    ED Discharge Orders     None          Suellen Sherran DELENA DEVONNA 02/03/24 1816    Garrick Charleston, MD 02/04/24 1126

## 2024-02-03 NOTE — ED Triage Notes (Signed)
 Pt arrived via POV c/o worsening SOB over past few weeks. Pt reports it feels like he is suffocating. Pt also reports clogged right ear.

## 2024-02-08 NOTE — Progress Notes (Signed)
 02/09/2024 7:12 PM   Douglas Martin. 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    SMALL INTESTINE SURGERY     TONSILECTOMY, ADENOIDECTOMY, BILATERAL MYRINGOTOMY AND TUBES  1977    Home Medications:  Allergies as of 02/09/2024       Reactions   Ibuprofen Nausea And Vomiting   Keflex [cephalexin] Rash   Niacin-like Flush        Medication List        Accurate as of February 08, 2024  7:12 PM. If you have any questions, ask your nurse or doctor.          amphetamine -dextroamphetamine  10 MG tablet Commonly known as: Adderall Take 0.5 tablets (5 mg total) by mouth daily. What changed:  how much to take when to take this reasons to take this   ascorbic acid 500 MG tablet Commonly known as: VITAMIN C Take 500 mg by mouth in the morning.   aspirin EC 81 MG tablet Take 81 mg by mouth 2 (two) times a week.   Astaxanthin 4 MG Caps Take 4 mg by mouth daily.   b complex vitamins capsule Take 1 capsule by mouth daily. 100% RDA   CENTRUM SILVER 50+MEN PO Take 1 tablet by mouth 2 (two) times a week.   co-enzyme Q-10 30 MG capsule Take 100 mg by mouth daily.   NAC 600 MG Caps Generic drug: Acetylcysteine Take 600 mg by mouth 2 (two) times a week.   naproxen  250 MG tablet Commonly known as:  NAPROSYN  Take 1 tablet (250 mg total) by mouth as needed. What changed: reasons to take this   Omega-3 1000 MG Caps Take 1,000 mg by mouth 2 (two) times a week.   traMADol  50 MG tablet Commonly known as: ULTRAM  Take 0.5 tablets (25 mg total) by mouth daily as needed. What changed: reasons to take this   vitamin B-12 100 MCG tablet Commonly known as: CYANOCOBALAMIN  Take 100 mcg by mouth 2 (two) times a week.   Vitamin D3 50 MCG (2000 UT) capsule Take 2,000 Units by mouth 2 (two) times a week.        Allergies:  Allergies  Allergen Reactions   Ibuprofen Nausea And Vomiting   Keflex [Cephalexin] Rash    Niacin-like Flush    Family History: Family History  Problem Relation Age of Onset   Valvular heart disease Mother    HIV Father    Stroke Father    Suicidality Brother    Cancer Paternal Uncle    Cancer Paternal Grandmother    Prostate cancer Paternal Grandfather    Cancer - Lung Neg Hx  Social History:  reports that he has quit smoking. His smoking use included cigars. He has been exposed to tobacco smoke. He has never used smokeless tobacco. He reports that he does not drink alcohol and does not use drugs.  ROS: All other review of systems were reviewed and are negative except what is noted above in HPI  Physical Exam: There were no vitals taken for this visit.  Constitutional:  Alert and oriented, No acute distress. HEENT: [place] AT, moist mucus membranes.  Trachea midline, no masses. Cardiovascular: No clubbing, cyanosis, or edema. Respiratory: Normal respiratory effort, no increased work of breathing. GI: No inguinal hernias GU: Normal phallus. No masses/lesions on penis, testis, scrotum. Prostate ***g smooth no nodules no induration.  Lymph: No cervical or inguinal lymphadenopathy. Skin: No rashes, bruises or suspicious lesions. Neurologic: Grossly intact, no focal deficits, moving all 4 extremities. Psychiatric: Normal mood and affect.  Laboratory  Data: Lab Results  Component Value Date   WBC 6.1 02/03/2024   HGB 15.0 02/03/2024   HCT 45.2 02/03/2024   MCV 93.2 02/03/2024   PLT 272 02/03/2024    Lab Results  Component Value Date   CREATININE 1.28 (H) 02/03/2024   PSA  2.5 on 6.17.2025  Lab Results  Component Value Date   TESTOSTERONE  503 12/22/2023    Lab Results  Component Value Date   HGBA1C 5.4 12/22/2023    Urinalysis   Pertinent Imaging: ***  Assessment:    Plan:    There are no diagnoses linked to this encounter.  No follow-ups on file.  Garnette CHRISTELLA Shack, MD  [facility] [place]

## 2024-02-09 ENCOUNTER — Ambulatory Visit: Admitting: Urology

## 2024-02-09 ENCOUNTER — Encounter: Payer: Self-pay | Admitting: Urology

## 2024-02-09 VITALS — BP 121/80 | HR 61

## 2024-02-09 DIAGNOSIS — N138 Other obstructive and reflux uropathy: Secondary | ICD-10-CM

## 2024-02-09 DIAGNOSIS — N401 Enlarged prostate with lower urinary tract symptoms: Secondary | ICD-10-CM

## 2024-02-09 DIAGNOSIS — R39198 Other difficulties with micturition: Secondary | ICD-10-CM

## 2024-02-09 LAB — URINALYSIS, ROUTINE W REFLEX MICROSCOPIC
Bilirubin, UA: NEGATIVE
Glucose, UA: NEGATIVE
Ketones, UA: NEGATIVE
Leukocytes,UA: NEGATIVE
Nitrite, UA: NEGATIVE
Protein,UA: NEGATIVE
RBC, UA: NEGATIVE
Specific Gravity, UA: 1.01 (ref 1.005–1.030)
Urobilinogen, Ur: 0.2 mg/dL (ref 0.2–1.0)
pH, UA: 7 (ref 5.0–7.5)

## 2024-02-09 MED ORDER — ALFUZOSIN HCL ER 10 MG PO TB24: 10.0000 mg | ORAL_TABLET | Freq: Every day | ORAL | 11 refills | Status: AC

## 2024-04-12 ENCOUNTER — Ambulatory Visit: Admitting: Urology

## 2024-04-29 ENCOUNTER — Other Ambulatory Visit: Payer: Self-pay

## 2024-04-29 ENCOUNTER — Telehealth: Payer: Self-pay

## 2024-04-29 DIAGNOSIS — E781 Pure hyperglyceridemia: Secondary | ICD-10-CM

## 2024-04-29 NOTE — Telephone Encounter (Signed)
 Patient came by office full blood work and the Testerone glucose blood work. . Also the C CRP Creactive protein and PSA .  Call patient when and if he needs to have this done.  He usually has this done when he saw Dr Melvenia. He would like to get his done before his appointment with laura. Please reach out to the patient

## 2024-05-04 ENCOUNTER — Other Ambulatory Visit: Payer: Self-pay

## 2024-05-04 DIAGNOSIS — I639 Cerebral infarction, unspecified: Secondary | ICD-10-CM

## 2024-05-04 DIAGNOSIS — M7918 Myalgia, other site: Secondary | ICD-10-CM

## 2024-05-04 DIAGNOSIS — N401 Enlarged prostate with lower urinary tract symptoms: Secondary | ICD-10-CM

## 2024-05-04 DIAGNOSIS — E781 Pure hyperglyceridemia: Secondary | ICD-10-CM

## 2024-06-13 ENCOUNTER — Ambulatory Visit: Admitting: Internal Medicine

## 2024-06-16 LAB — CBC WITH DIFFERENTIAL/PLATELET
Basophils Absolute: 0.1 x10E3/uL (ref 0.0–0.2)
Basos: 1 %
EOS (ABSOLUTE): 0.1 x10E3/uL (ref 0.0–0.4)
Eos: 2 %
Hematocrit: 48.9 % (ref 37.5–51.0)
Hemoglobin: 15.8 g/dL (ref 13.0–17.7)
Immature Grans (Abs): 0 x10E3/uL (ref 0.0–0.1)
Immature Granulocytes: 0 %
Lymphocytes Absolute: 2.1 x10E3/uL (ref 0.7–3.1)
Lymphs: 34 %
MCH: 30.5 pg (ref 26.6–33.0)
MCHC: 32.3 g/dL (ref 31.5–35.7)
MCV: 94 fL (ref 79–97)
Monocytes Absolute: 0.7 x10E3/uL (ref 0.1–0.9)
Monocytes: 11 %
Neutrophils Absolute: 3.2 x10E3/uL (ref 1.4–7.0)
Neutrophils: 52 %
Platelets: 319 x10E3/uL (ref 150–450)
RBC: 5.18 x10E6/uL (ref 4.14–5.80)
RDW: 12.3 % (ref 11.6–15.4)
WBC: 6.2 x10E3/uL (ref 3.4–10.8)

## 2024-06-16 LAB — CMP14+EGFR
ALT: 29 IU/L (ref 0–44)
AST: 25 IU/L (ref 0–40)
Albumin: 4.6 g/dL (ref 3.9–4.9)
Alkaline Phosphatase: 82 IU/L (ref 47–123)
BUN/Creatinine Ratio: 11 (ref 10–24)
BUN: 14 mg/dL (ref 8–27)
Bilirubin Total: 0.7 mg/dL (ref 0.0–1.2)
CO2: 25 mmol/L (ref 20–29)
Calcium: 9.3 mg/dL (ref 8.6–10.2)
Chloride: 101 mmol/L (ref 96–106)
Creatinine, Ser: 1.24 mg/dL (ref 0.76–1.27)
Globulin, Total: 2.1 g/dL (ref 1.5–4.5)
Glucose: 88 mg/dL (ref 70–99)
Potassium: 4.8 mmol/L (ref 3.5–5.2)
Sodium: 140 mmol/L (ref 134–144)
Total Protein: 6.7 g/dL (ref 6.0–8.5)
eGFR: 63 mL/min/1.73 (ref >59)

## 2024-06-16 LAB — TSH+FREE T4
Free T4: 1.24 ng/dL (ref 0.82–1.77)
TSH: 4.57 u[IU]/mL — ABNORMAL HIGH (ref 0.450–4.500)

## 2024-06-16 LAB — LIPID PANEL
Chol/HDL Ratio: 3.2 ratio (ref 0.0–5.0)
Cholesterol, Total: 200 mg/dL — ABNORMAL HIGH (ref 100–199)
HDL: 63 mg/dL (ref >39)
LDL Chol Calc (NIH): 119 mg/dL — ABNORMAL HIGH (ref 0–99)
Triglycerides: 100 mg/dL (ref 0–149)
VLDL Cholesterol Cal: 18 mg/dL (ref 5–40)

## 2024-06-16 LAB — VITAMIN D 25 HYDROXY (VIT D DEFICIENCY, FRACTURES): Vit D, 25-Hydroxy: 29.5 ng/mL — ABNORMAL LOW (ref 30.0–100.0)

## 2024-06-16 LAB — B12 AND FOLATE PANEL
Folate: 19.7 ng/mL (ref >3.0)
Vitamin B-12: 1220 pg/mL (ref 232–1245)

## 2024-06-16 LAB — TESTOSTERONE,FREE AND TOTAL
Testosterone, Free: 18.6 pg/mL — ABNORMAL HIGH (ref 6.6–18.1)
Testosterone: 697 ng/dL (ref 264–916)

## 2024-06-16 LAB — HEMOGLOBIN A1C
Est. average glucose Bld gHb Est-mCnc: 111 mg/dL
Hgb A1c MFr Bld: 5.5 % (ref 4.8–5.6)

## 2024-06-22 ENCOUNTER — Ambulatory Visit

## 2024-06-22 VITALS — BP 110/68 | HR 63 | Ht 71.0 in | Wt 189.1 lb

## 2024-06-22 DIAGNOSIS — Z125 Encounter for screening for malignant neoplasm of prostate: Secondary | ICD-10-CM

## 2024-06-22 DIAGNOSIS — E781 Pure hyperglyceridemia: Secondary | ICD-10-CM

## 2024-06-22 DIAGNOSIS — R3912 Poor urinary stream: Secondary | ICD-10-CM | POA: Diagnosis not present

## 2024-06-22 DIAGNOSIS — E559 Vitamin D deficiency, unspecified: Secondary | ICD-10-CM | POA: Diagnosis not present

## 2024-06-22 DIAGNOSIS — N401 Enlarged prostate with lower urinary tract symptoms: Secondary | ICD-10-CM | POA: Diagnosis not present

## 2024-06-22 DIAGNOSIS — I6930 Unspecified sequelae of cerebral infarction: Secondary | ICD-10-CM | POA: Diagnosis not present

## 2024-06-22 DIAGNOSIS — I639 Cerebral infarction, unspecified: Secondary | ICD-10-CM

## 2024-06-22 NOTE — Progress Notes (Unsigned)
 Established Patient Office Visit  Subjective   Patient ID: Douglas Martin., male    DOB: [birth year]  Age: 68 y.o. MRN: [id number]  Chief Complaint  Patient presents with   Medical Management of Chronic Issues    6 month follow up     HPI  Patient Active Problem List   Diagnosis Date Noted   Chronic musculoskeletal pain 12/24/2022   Bilateral hearing loss 09/22/2022   Sun-damaged skin 05/09/2022   Loss of sense of smell 04/11/2022   Stroke (HCC) 01/06/2022   Annual physical exam 01/02/2022   Benign prostatic hyperplasia with weak urinary stream 01/02/2022   Screening for colon cancer 01/02/2022   Impaired vision in both eyes 01/02/2022   COVID-19 08/29/2021   Encounter for general adult medical examination with abnormal findings 04/10/2021   Hypertriglyceridemia 06/15/2019   Chronic back pain 06/15/2019   Excessive sleepiness 03/19/2017   Anoxic brain injury (HCC) 03/19/2017    ROS    Objective:     BP 130/83   Pulse 63   Ht 5' 11 (1.803 m)   Wt 189 lb 1.9 oz (85.8 kg)   SpO2 97%   BMI 26.38 kg/m  BP Readings from Last 3 Encounters:  06/22/24 130/83  02/09/24 121/80  02/03/24 (!) 142/87   Wt Readings from Last 3 Encounters:  06/22/24 189 lb 1.9 oz (85.8 kg)  02/03/24 195 lb 12.3 oz (88.8 kg)  12/22/23 195 lb 12.8 oz (88.8 kg)     Physical Exam   No results found for any visits on 06/22/24.  Last CBC Lab Results  Component Value Date   WBC 6.2 06/14/2024   HGB 15.8 06/14/2024   HCT 48.9 06/14/2024   MCV 94 06/14/2024   MCH 30.5 06/14/2024   RDW 12.3 06/14/2024   PLT 319 06/14/2024   Last metabolic panel Lab Results  Component Value Date   GLUCOSE 88 06/14/2024   NA 140 06/14/2024   K 4.8 06/14/2024   CL 101 06/14/2024   CO2 25 06/14/2024   BUN 14 06/14/2024   CREATININE 1.24 06/14/2024   EGFR 63 06/14/2024   CALCIUM  9.3 06/14/2024   PROT 6.7 06/14/2024   ALBUMIN 4.6 06/14/2024   LABGLOB 2.1 06/14/2024   AGRATIO 2.4 (H)  09/22/2022   BILITOT 0.7 06/14/2024   ALKPHOS 82 06/14/2024   AST 25 06/14/2024   ALT 29 06/14/2024   ANIONGAP 11 02/03/2024   Last lipids Lab Results  Component Value Date   CHOL 200 (H) 06/14/2024   HDL 63 06/14/2024   LDLCALC 119 (H) 06/14/2024   TRIG 100 06/14/2024   CHOLHDL 3.2 06/14/2024   Last hemoglobin A1c Lab Results  Component Value Date   HGBA1C 5.5 06/14/2024   Last thyroid  functions Lab Results  Component Value Date   TSH 4.570 (H) 06/14/2024   FREET4 1.24 06/14/2024   Last vitamin D  Lab Results  Component Value Date   VD25OH 29.5 (L) 06/14/2024   Last vitamin B12 and Folate Lab Results  Component Value Date   VITAMINB12 1,220 06/14/2024   FOLATE 19.7 06/14/2024      The ASCVD Risk score (Arnett DK, et al., 2019) failed to calculate for the following reasons:   Risk score cannot be calculated because patient has a medical history suggesting prior/existing ASCVD   * - Cholesterol units were assumed    Assessment & Plan:   Problem List Items Addressed This Visit   None   No follow-ups on file.  Leita Longs, FNP

## 2024-06-23 LAB — PSA: Prostate Specific Ag, Serum: 3 ng/mL (ref 0.0–4.0)

## 2024-06-23 LAB — C-REACTIVE PROTEIN: CRP: <1 mg/L (ref 0–10)

## 2024-06-25 ENCOUNTER — Ambulatory Visit: Payer: Self-pay

## 2024-06-25 DIAGNOSIS — E559 Vitamin D deficiency, unspecified: Secondary | ICD-10-CM | POA: Insufficient documentation

## 2024-06-25 NOTE — Assessment & Plan Note (Signed)
 Chronic BPH with LUTS. Cialis  effective but caused tinnitus. Considering Uroxatral , concerned about hypotension due to training. - Consider starting Uroxatral  after discussing potential side effects.

## 2024-06-25 NOTE — Assessment & Plan Note (Signed)
 Triglycerides improved from 118 to 100. HDL increased, indicating positive lipid profile changes.

## 2024-06-25 NOTE — Assessment & Plan Note (Signed)
 Vitamin D  level low at 29. Not taking supplements.  - Recommended resuming vitamin D  supplementation.

## 2024-06-25 NOTE — Assessment & Plan Note (Signed)
 Monitoring C-reactive protein as part of CVA history. - Ordered C-reactive protein test.

## 2024-07-05 ENCOUNTER — Other Ambulatory Visit: Payer: Self-pay

## 2024-07-05 ENCOUNTER — Ambulatory Visit: Payer: Self-pay

## 2024-07-05 MED ORDER — OSELTAMIVIR PHOSPHATE 75 MG PO CAPS: 75.0000 mg | ORAL_CAPSULE | Freq: Two times a day (BID) | ORAL | 0 refills | Status: AC

## 2024-07-05 NOTE — Telephone Encounter (Signed)
"  Patient advised  "

## 2024-07-05 NOTE — Telephone Encounter (Signed)
 Tamiflu sent in to [facility]

## 2024-07-05 NOTE — Telephone Encounter (Signed)
 First attempt to reach patient for triage.  Voicemail not set up. Placed in callbacks for additional attempts.   Copied from CRM #[id number]. Topic: Clinical - Prescription Issue >> Jul 05, 2024 10:25 AM Darshell M wrote: Reason for CRM: Patient has had flu for a week. Requesting tamiflu be called in.   [facility] - [place], [place] - [address] [place] [address] Phone: [phone] Fax: [phone]   Patient CB# [phone], patient does not use mychart

## 2024-07-05 NOTE — Telephone Encounter (Signed)
 FYI Only or Action Required?: Action required by provider: clinical question for provider and update on patient condition.  Patient was last seen in primary care on 06/22/2024 by Bevely Doffing, FNP.  Called Nurse Triage reporting Influenza.  Symptoms began 6 days ago.  Interventions attempted: OTC medications: Vitamin C, aspirin and Prescription medications: 1/2 Vicodin.  Symptoms are: stable.  Triage Disposition: Call PCP Within 24 Hours  Patient/caregiver understands and will follow disposition?: Yes           Copied from CRM #[id number]. Topic: Clinical - Prescription Issue >> Jul 05, 2024 10:25 AM Darshell M wrote: Reason for CRM: Patient has had flu for a week. Requesting tamiflu be called in.    [facility] - [place], [place] - [address] [place] [address] Phone: [phone] Fax: [phone]     Patient CB# [phone], patient does not use mychart Reason for Disposition  Patient is HIGH RISK (e.g., age > 64 years, pregnant, HIV+, or chronic medical condition)  Answer Assessment - Initial Assessment Questions 1. WORST SYMPTOM: What is your worst symptom? (e.g., cough, runny nose, muscle aches, headache, sore throat, fever)      Fatigue, feels like hit by a train, nausea 2. ONSET: When did your flu symptoms start?      06/29/24 3. COUGH: How bad is the cough?       No cough. 4. RESPIRATORY DISTRESS: Describe your breathing.      Normal. Denies SOB. 5. FEVER: Do you have a fever? If Yes, ask: What is your temperature, how was it measured, and when did it start?     Unsure, he did not measure but states he had a high fever the first few days of symptoms.  6. EXPOSURE: Were you exposed to someone with influenza?       No.  7. FLU VACCINE: Did you get a flu shot this year?     No.  8. HIGH RISK DISEASE: Do you have any chronic medical problems? (e.g., heart or lung disease, asthma, weak immune  system, or other HIGH RISK conditions)     No diseases, high risk due to age.  9. PREGNANCY: Is there any chance you are pregnant? When was your last menstrual period?     N/A. 10. OTHER SYMPTOMS: Do you have any other symptoms?  (e.g., runny nose, muscle aches, headache, sore throat)       No chest pain, SOB, headache, earaches, or neck stiffness.  Protocols used: Influenza (Flu) - [facility]

## 2024-08-10 ENCOUNTER — Ambulatory Visit: Admitting: Cardiology

## 2024-10-17 ENCOUNTER — Ambulatory Visit

## 2024-12-21 ENCOUNTER — Ambulatory Visit: Payer: Self-pay
